# Patient Record
Sex: Male | Born: 1970
Health system: Western US, Academic
[De-identification: ages and names within clinical notes are randomized; demographics above are authoritative.]

---

## 2017-07-28 ENCOUNTER — Ambulatory Visit

## 2017-07-28 DIAGNOSIS — G8929 Other chronic pain: Secondary | ICD-10-CM

## 2017-07-28 DIAGNOSIS — M545 Low back pain: Secondary | ICD-10-CM

## 2017-07-28 NOTE — Progress Notes
S - Patient is seen for back pain. he is also seen by doctor Zachery Conch for his psychiatric problem. 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 the pain kept him from sleeping all night. he did not have Tylenol or Ibuprofen to relieve his pain, and he is hoping to get some today. Apparently he's new in [place]. when he was in [place] he was told that he had herniated disc, and he was prescribed Soma to help easing his back pain. he also has bad knees.    PMH - psychiatric problem with depression, currently on trazodone, bupropion & aripiprazole  DM on metformin & inj insulin (got it from hospital ER)  Seizure, on levetiracetam    Review of Systems   Constitutional: Negative for fever and chills.   HENT: Negative for sore throat and tinnitus.    Eyes: Negative for pain, discharge and redness.   Respiratory: Negative for cough, hemoptysis and wheezing.    Cardiovascular: Negative for chest pain, palpitations, orthopnea and PND.   Gastrointestinal: Negative for heartburn, nausea, vomiting and diarrhea.   Genitourinary: Negative for dysuria and flank pain.   Skin: Negative for itching and rash.     O - no distress, shaking right lower leg repetitively  Able to stand up from chair easily, gait normal  Tenderness over back from t1 level down to iliac crest, midline and paravetebral muscles  Straight leg raising test - no difficulty, no radiating pain  Knees full ROM    Last Recorded Vital Signs:    07/28/17 0921   BP: 96/59   Pulse: 71   Resp: 16   Temp: 36.9 ???C (98.4 ???F)   SpO2: 98%     A - back pain from back muscle strain. He needs to strengthen his back muscles. There is no evidence of disc disease with radiculopathy at this exam    Psychiatric problem, seen by Dr Zachery Conch today. Trazodone 100 mg HS recommended. He is short of this med, so limited amount will be offered to him today    P - ibuprofen 400 mg 40# dispensed, to be taken 1 q6h as needed  Trazodone 50 mg 20#, take 2 HS Note given to avoid straining his back for 2 weeks  See PT to strengthen his back  FU 2 wks

## 2017-08-11 ENCOUNTER — Ambulatory Visit

## 2017-08-11 ENCOUNTER — Ambulatory Visit: Attending: Family

## 2017-08-11 DIAGNOSIS — B353 Tinea pedis: Secondary | ICD-10-CM

## 2017-08-11 DIAGNOSIS — F259 Schizoaffective disorder, unspecified: Secondary | ICD-10-CM

## 2017-08-11 DIAGNOSIS — E1165 Type 2 diabetes mellitus with hyperglycemia: Secondary | ICD-10-CM

## 2017-08-11 DIAGNOSIS — K219 Gastro-esophageal reflux disease without esophagitis: Secondary | ICD-10-CM

## 2017-08-11 DIAGNOSIS — E1142 Type 2 diabetes mellitus with diabetic polyneuropathy: Secondary | ICD-10-CM

## 2017-08-11 MED ORDER — LURASIDONE HCL 20 MG PO TABS
20 mg | ORAL_TABLET | Freq: Every day | ORAL | 2 refills | Status: AC
Start: 2017-08-11 — End: ?

## 2017-08-11 MED ORDER — OMEPRAZOLE 20 MG PO CPDR
20 mg | ORAL_CAPSULE | Freq: Every morning | ORAL | 1 refills | Status: AC
Start: 2017-08-11 — End: ?

## 2017-08-11 MED ORDER — HYDROXYZINE HCL 50 MG PO TABS
50 mg | ORAL_TABLET | Freq: Every evening | ORAL | 2 refills | Status: AC | PRN
Start: 2017-08-11 — End: ?

## 2017-08-11 MED ORDER — INSULIN GLARGINE 100 UNIT/ML SC SOPN
25 [IU] | PEN_INJECTOR | Freq: Two times a day (BID) | SUBCUTANEOUS | 1 refills | Status: AC
Start: 2017-08-11 — End: 2017-08-17

## 2017-08-11 MED ORDER — TERBINAFINE HCL 1 % EX CREA
Freq: Two times a day (BID) | TOPICAL | 2 refills | Status: AC
Start: 2017-08-11 — End: ?

## 2017-08-11 NOTE — Progress Notes
[facility]  Brief SOAP Note    SUBJECTIVE:  Chief Complaint   Patient presents with   ??? Diabetes     Pt here for psych visit with Dr. Zachery Conch and I was pulled into case to consult for DM management. Pt reports being in [facility] for four days and was just released in last day or so. Pt given refill RX for metformin 1000mg  BID and glipizide 5mg  daily. No refill RX for lantus insulin, but reports he is taking lantus via pen - 20 units BID. Pt has an empty pen with him in his bag. Pt states this morning he took the metformin, glipizide and the last of the lantus insulin, and his BS in the clinic now is 242. [place] [facility] [place] [facility]. Pt reports having seizures and taking Keppra. Also with mental health conditions and was taking trazodone, Abilify and bupropion. After visit with Dr. Zachery Conch today he is taking latuda and bupropion. Pt states he feels some numbness and tingling in his feet at night. Otherwise not feeling effects of high blood sugar.    Along with the GI upset reported with metformin doses he also c/o acid reflux. Doesn't report taking any acid reflux medication.    Past Medical History:   Diagnosis Date   ??? Arthritis    ??? Bipolar 2 disorder (HCC/RAF)    ??? Chronic back pain    ??? Diabetes (HCC/RAF)    ??? Hypertension    ??? Insomnia    ??? Seizures (HCC/RAF)      Past Surgical History:   Procedure Laterality Date   ??? APPENDECTOMY     ??? KNEE SURGERY  2006/2009    right knee 2 surgeries     No Known Allergies       Outpatient Prescriptions Marked as Taking for the 08/11/17 encounter (Office Visit) with Cliffton Asters., NP:   ???  buPROPion, SR, 150 mg 12 hr tablet, 150 mg, Oral, QAM,   ???  glipiZIDE 5 mg tablet, 5 mg, Oral, Daily with breakfast,   ???  levETIRAcetam 100 mg/mL solution, 10 mg/kg, Oral, Daily,   ???  metFORMIN 1000 mg tablet, 1,000 mg, Oral, BID w/meals,      Family History   Problem Relation Age of Onset   ??? Diabetes Mother    ??? Breast cancer Mother ??? Diabetes Father    ??? Hypertension Father    ??? Mental illness Father    ??? Mental illness Sister    ??? Mental retardation Maternal Aunt    ??? Mental retardation Maternal Uncle      Social History     Social History   ??? Marital status: Single     Spouse name: N/A   ??? Number of children: N/A   ??? Years of education: N/A     Occupational History   ??? Not on file.     Social History Main Topics   ??? Smoking status: Former Smoker     Types: Cigarettes   ??? Smokeless tobacco: Never Used   ??? Alcohol use No      Comment: Quit 04/2017   ??? Drug use: Yes     Types: Marijuana      Comment: Quit  4 years ago   ??? Sexual activity: Not Currently     Partners: Female     Other Topics Concern   ??? Do You Exercise At Least A Day, 3 Or More Days  A Week? No   ??? Types Of Exercise? (List In Comments) No   ??? Do You Follow A Special Diet? No     smaller meals    ??? Vegan? No   ??? Vegetarian? No   ??? Pescatarian? No   ??? Lactose Free? No   ??? Gluten Free? No   ??? Omnivore? No     Social History Narrative   ??? No narrative on file     Review of Systems   Constitutional: Negative for chills, fever and malaise/fatigue.   Eyes: Negative for blurred vision.   Gastrointestinal: Positive for heartburn.        (+) bloating, cramping associated with taking metformin.   Neurological: Negative for dizziness and headaches.   Psychiatric/Behavioral: Positive for depression. Negative for suicidal ideas.     OBJECTIVE:  None taken today since it was a primarily psych encounter    Physical Exam   Constitutional: He is well-developed, well-nourished, and in no distress. No distress.   Neurological: He is alert. Gait normal.   Skin: Skin is warm.   Pedal pulses are strong and intact 2+. Bilateral feet with dry flaking & peeling skin around toes and on sides and plantar surfaces. With very thickened dead skin on bilateral heels. Has moist white skin between 4th and 5th toes on right foot and a fissure in the skin due to the moisture and fungal infection. Monofilament exam shows several areas of lack of sensation on balls of feet and middle toes on bilateral feet. Pt also not able to sense the filament on the heels. No ulcers, erythema, edema, calluses or corns noted. No toenail fungus noted.   Psychiatric:   Affect flat; memory is suspicious as he tells different things to different providers.     ASSESSMENT/PLAN:  1. 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 Continue taking glipizide and metformin as ordered by previous doctors. Return next week for f/u and BS check.  - insulin glargine (LANTUS SOLOSTAR) 100 units/mL injection pen; Inject 0.25 mLs (25 Units total) under the skin two (2) times daily.  Dispense: 5 pen; Refill: 1  - Diabetes Foot Exam Performed by Provider Today (Not A Referral)    2. Gastroesophageal reflux disease, esophagitis presence not specified  Acid reflux vs metformin SE. Will f/u next week. For now, take omeprazole daily 30 min before other meds and food.  - omeprazole 20 mg DR capsule; Take 1 capsule (20 mg total) by mouth every morning before breakfast.  Dispense: 30 capsule; Refill: 1    3. Tinea pedis of both feet  The pathology of fungus on feet were discussed. Keep feet clean and dry to avoid fungal growth. Use socks with shoes, and allow feet to be exposed to air at least for 30 min twice a day. After cleaning feet and allowing feet to dry completely, apply prescribed fungal cream twice a day, for as much as 3-4 weeks. If not seeing improvement within 1 week, return for evaluation.  - terbinafine 1% cream; Apply topically two (2) times daily To both feet.  Dispense: 42 g; Refill: 2    FOLLOW UP:  Return in about 1 week (around 08/18/2017) for follow up DMT2.    Cliffton Asters, NP 08/11/2017 5:38 PM

## 2017-08-12 NOTE — Progress Notes
PATIENT: Angel Bishop   MRN: [id number]   DOB: 1970/10/28      DATE OF SERVICE: 08/11/2017   TIME OF SERVICE: 5:24 PM      Interval history: The patient was seen and examined by me today.     Subjective:       ***     Objective:      Vitals: See rooming notes    Sleep: ***    Appetite: {PSY77:22919}  Concentration: {NFA21:30865}   Energy: {HQI69:62952}    Medications:  Current Outpatient Prescriptions   Medication Sig   ??? buPROPion, SR, 150 mg 12 hr tablet Take 150 mg by mouth every morning.   ??? glipiZIDE 5 mg tablet Take 5 mg by mouth daily with breakfast.   ??? hydrOXYzine 50 mg tablet Take 1 tablet (50 mg total) by mouth at bedtime as needed.   ??? insulin glargine (LANTUS SOLOSTAR) 100 units/mL injection pen Inject 0.25 mLs (25 Units total) under the skin two (2) times daily.   ??? levETIRAcetam 100 mg/mL solution Take 10 mg/kg by mouth daily.   ??? lurasidone 20 mg tablet Take 1 tablet (20 mg total) by mouth daily with breakfast.   ??? metFORMIN 1000 mg tablet Take 1,000 mg by mouth two (2) times daily with meals.   ??? omeprazole 20 mg DR capsule Take 1 capsule (20 mg total) by mouth every morning before breakfast.   ??? terbinafine 1% cream Apply topically two (2) times daily To both feet.     No current facility-administered medications for this visit.           Adherence to medication regimen:  { :25048}  Side Effects to Medications: EPS noted:  {yes/no:311178}, Chest pain:   {yes/no:311178}, Palpitations: {yes/no:311178}, Headache:  {yes/no:311178}, N/V/D:  {yes/no:311178}, Tremors:    {yes/no:311178}         Mental Status Examination:   Appearance: {PSY 43:22746}   Appears: {PSY 44:22745}  Eye Contact: { :20673}   Behavior and Motor Activity: {PSY 46:22744::''No psychomotor agitation, retardation, tremor or tremulousness.''}  Attitude: {PSY 47:22743}   Speech: {PSY 48:22742::''No abnormalities noted (spontaneous, regular in rate, rhythm, volume and quantity).''}  Affect: {PSY 49:22741}   Mood: {PSY 50:22739} Thought Process: {PSY 51:22738}  Thought Content: {PSY 52:22737}   Perception: {PSY 57:22733}  Cognition: {PSY 60:22711}  Judgment: {PSY 65:22707}  Insight: {PSY 65:22707}    Lab Data:   *** HELP TEXT ***    This SmartLink shows component results for current & previous visits.    It will accept a parameter to specify a lookback time duration.    For example, you may enter .RESULTRCNT[6W    The ''6W'' instructs the system to search 6 weeks back from now to find and display all component values in that time period.    Entry for this parameter may be in the form #H or #W. The ''#'' indicates a number and the H or W stand for hours or weeks respectively.[6W      Assessment:      Diagnoses and all orders for this visit:    Schizoaffective disorder, unspecified type (HCC/RAF)    Other orders  -     lurasidone 20 mg tablet; Take 1 tablet (20 mg total) by mouth daily with breakfast.  -     hydrOXYzine 50 mg tablet; Take 1 tablet (50 mg total) by mouth at bedtime as needed.              To Address at  Next Visit:   No Follow-up on file.        Author: Lazaro Arms. Zachery Conch, NP  08/11/2017 5:24 PM

## 2017-08-17 MED ORDER — INSULIN GLARGINE 100 UNIT/ML SC SOLN
50 [IU] | Freq: Every morning | SUBCUTANEOUS | 0 refills | Status: AC
Start: 2017-08-17 — End: ?

## 2017-08-17 MED ORDER — INSULIN SYRINGE-NEEDLE U-100 31G X 5/16'' 0.5 ML MISC
1 | Freq: Every day | SUBCUTANEOUS | 0 refills | Status: AC
Start: 2017-08-17 — End: ?

## 2017-08-18 ENCOUNTER — Ambulatory Visit

## 2017-08-18 ENCOUNTER — Ambulatory Visit: Attending: Family

## 2019-07-25 IMAGING — CR SACRUM-COCCYX 2 VWS MIN
1 series · 3 of 3 positions shown · non-contrast
Comparison: None

HISTORY/INDICATIONS:   Radiculopathy sacral and sacrococcygeal region
TECHNIQUE: Sacrum and coccyx  3 views

[Series 1: t sacrum ap · 0.15mm/px · 3 of 3 slices shown]
[im 1/3]
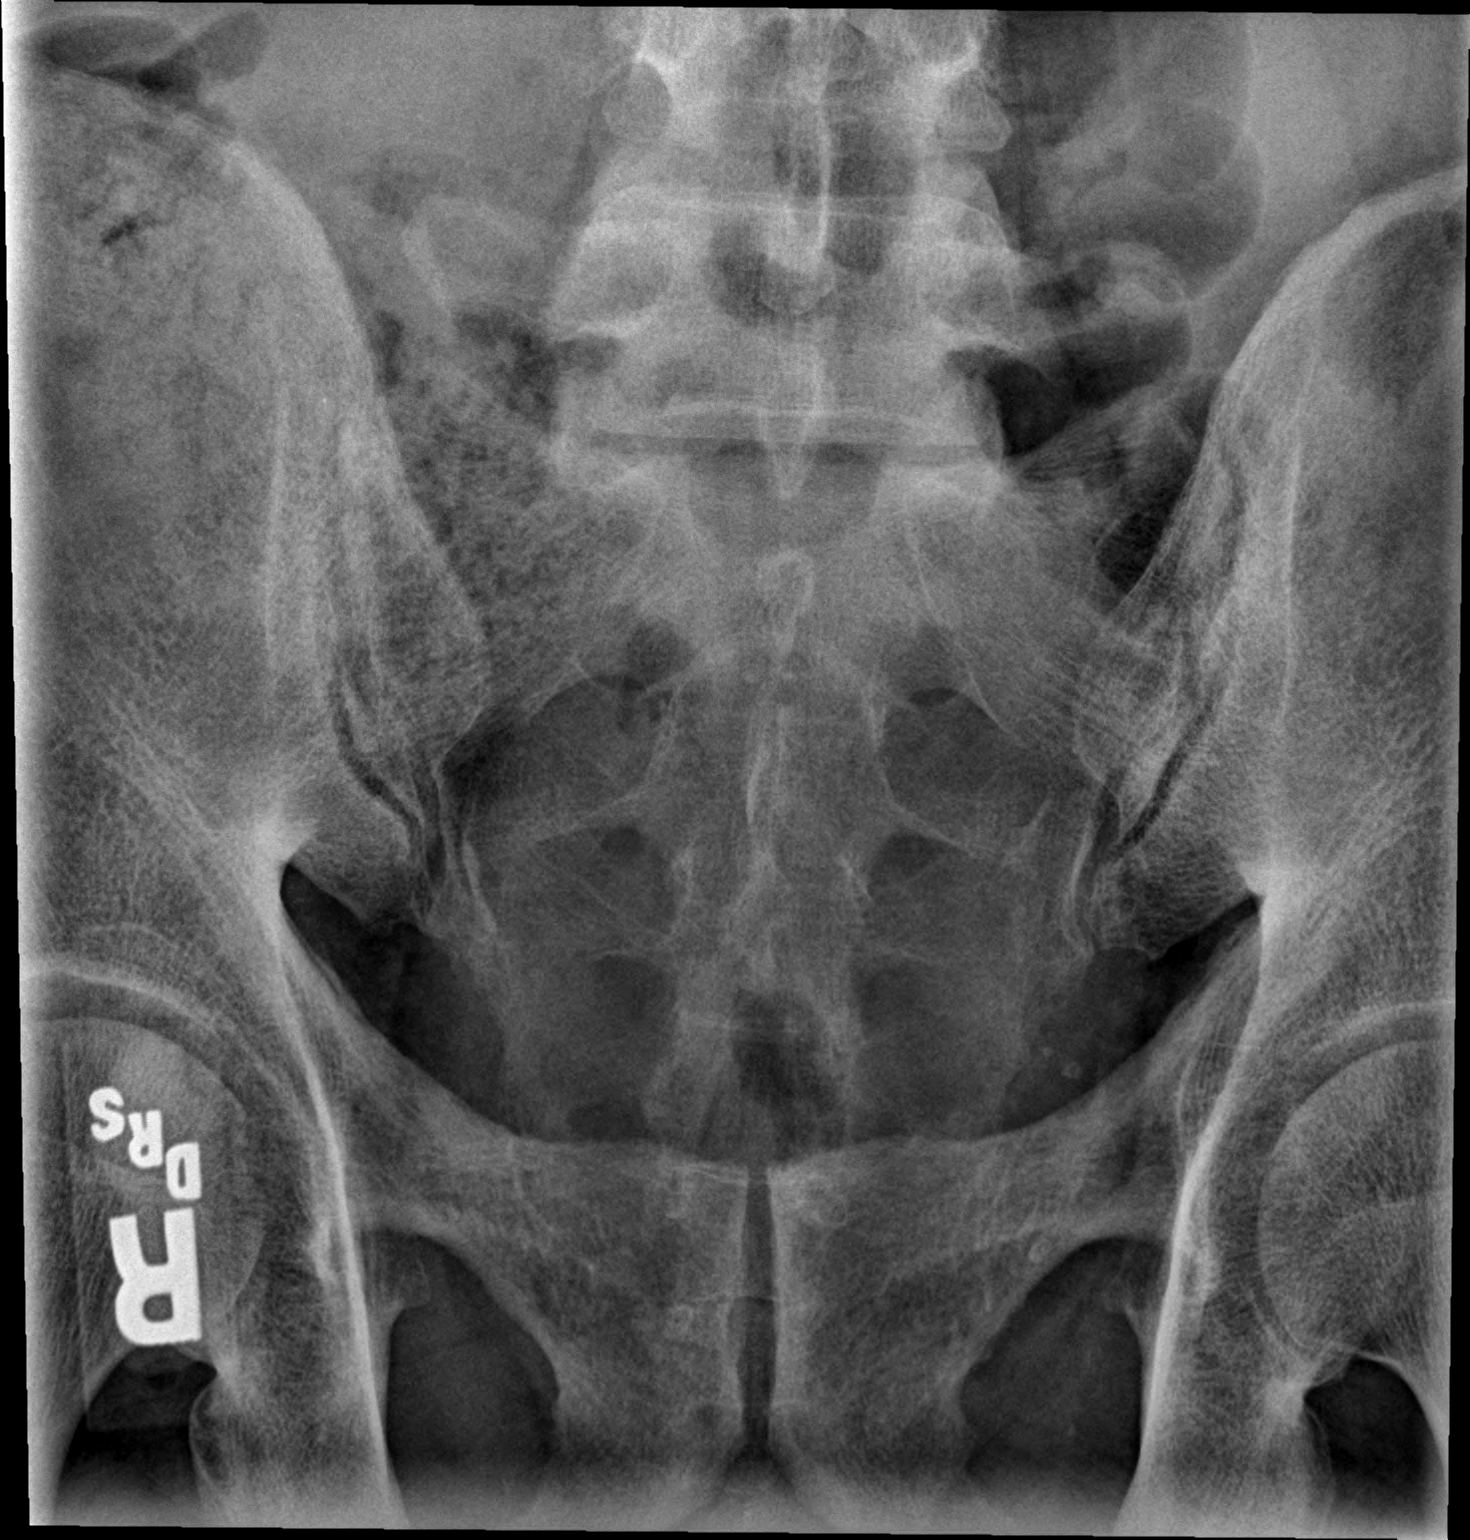
[im 2/3]
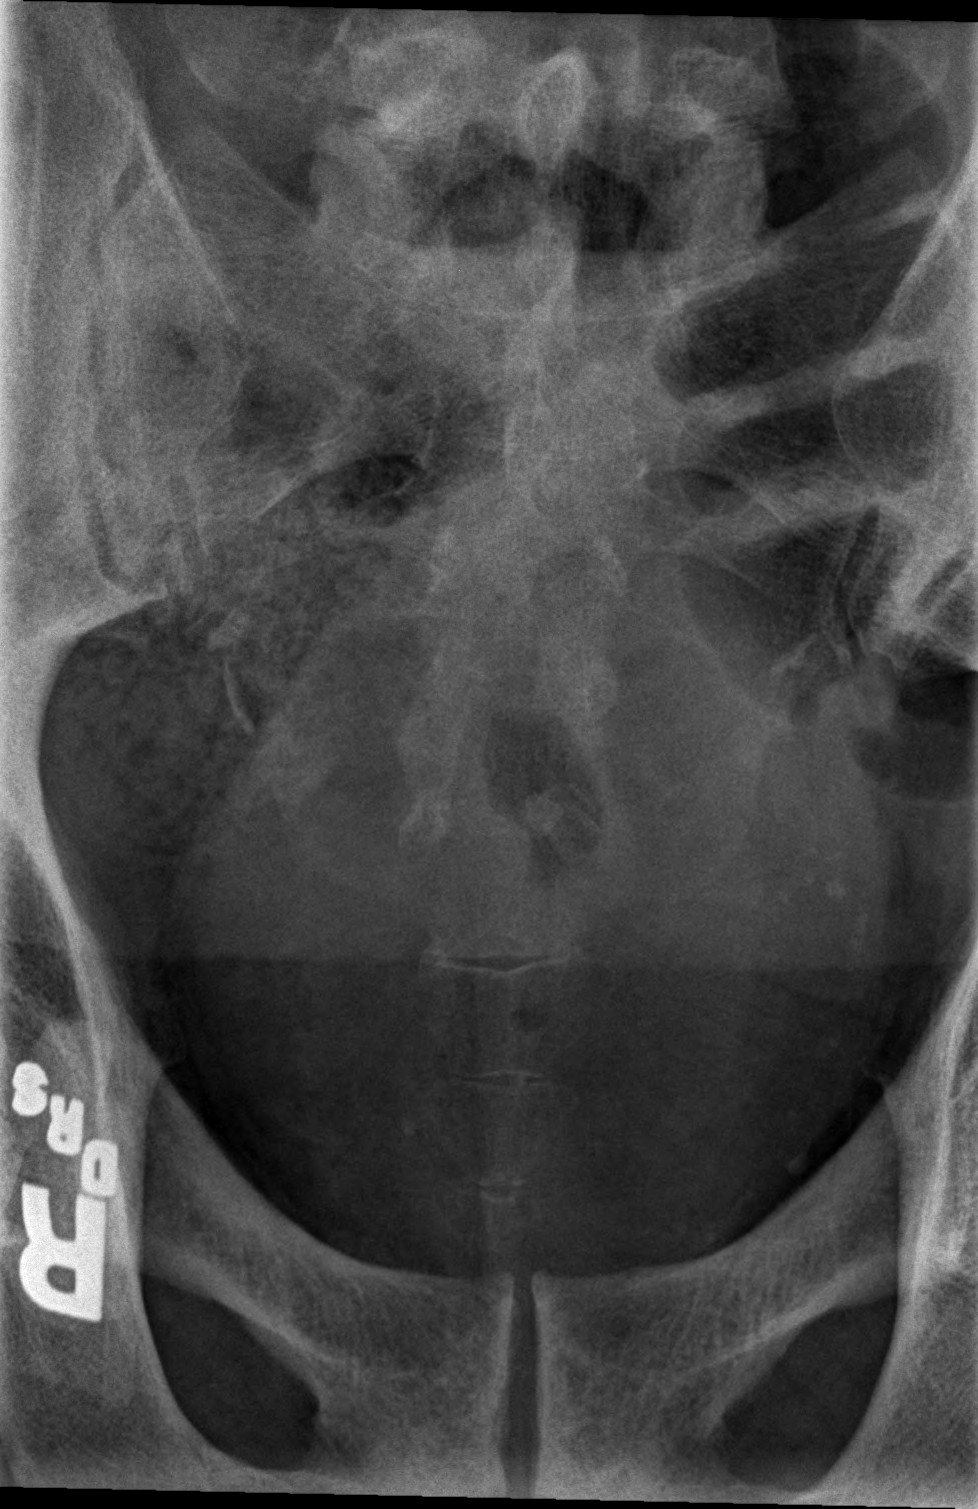
[im 3/3]
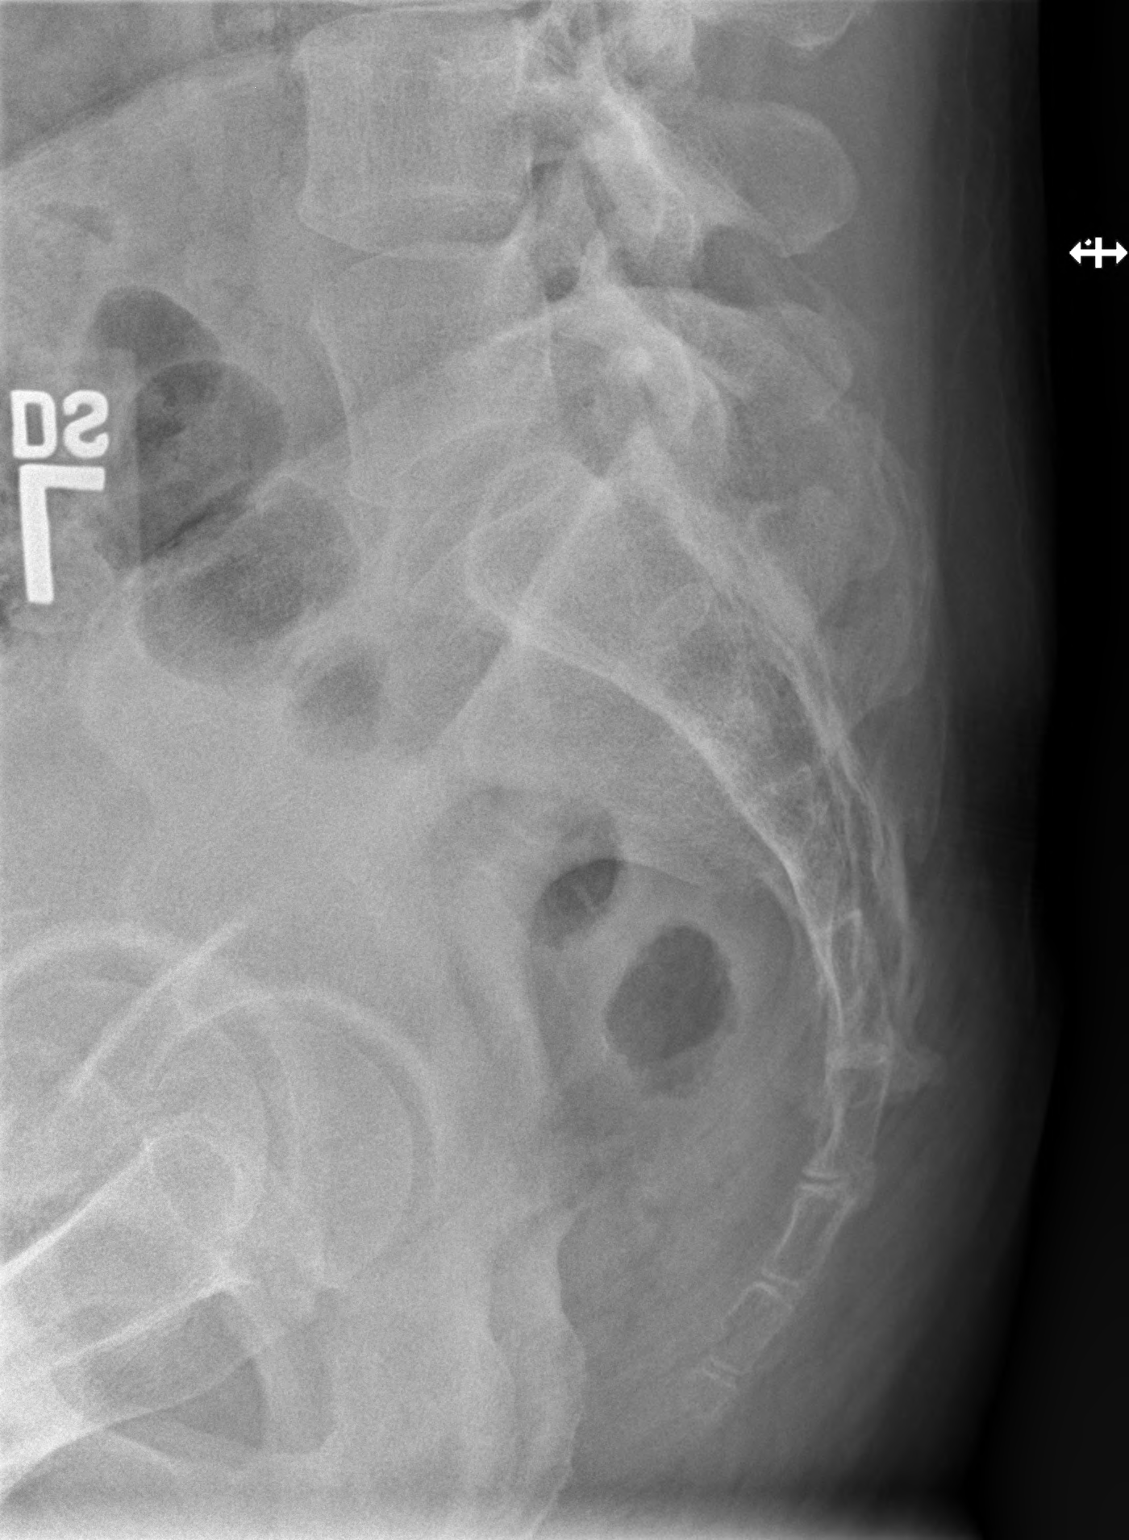

[3 of 3 positions shown; findings below may reference images not displayed]

FINDINGS: Views of the sacrum and coccyx reveal no evidence of acute fracture or other osseous abnormality.
IMPRESSION: Unremarkable sacrum and coccyx.

## 2019-07-25 IMAGING — CR L-SPINE 4 VWS MIN
1 series · 5 of 5 positions shown · non-contrast
Comparison: None

Lumbar spine 5 views
HISTORY: Radiculopathy, lumbosacral region

[Series 1: t lumbar spine ap · 0.15mm/px · 5 of 5 slices shown]
[im 1/5]
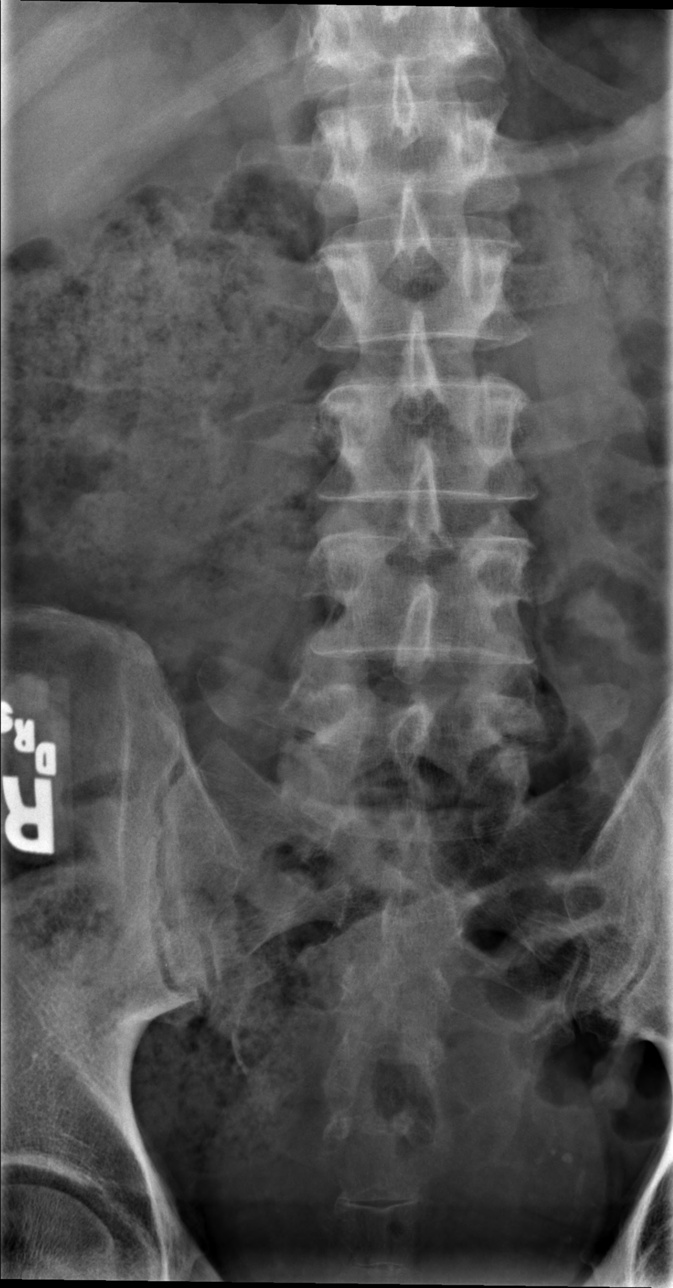
[im 2/5]
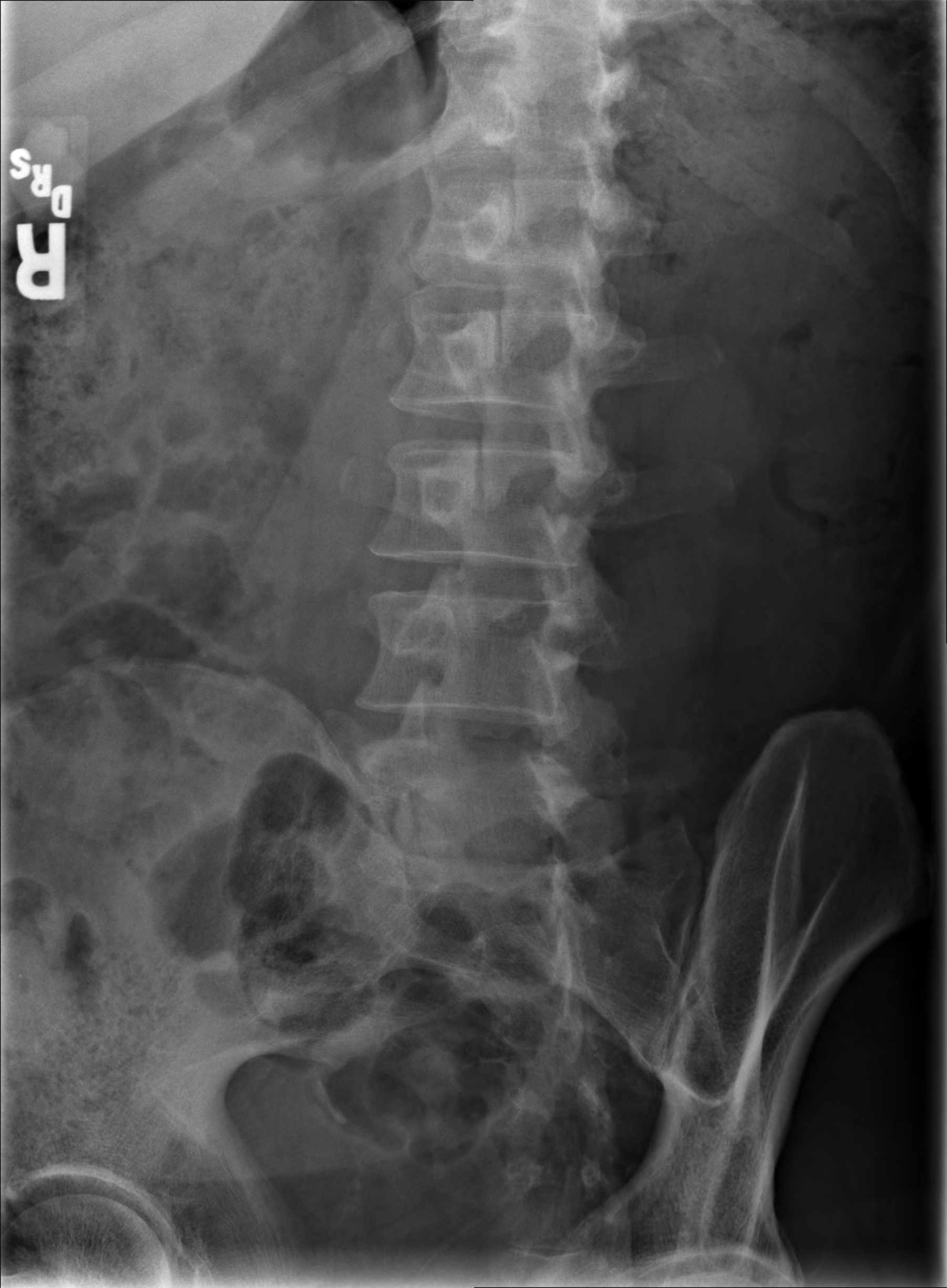
[im 3/5]
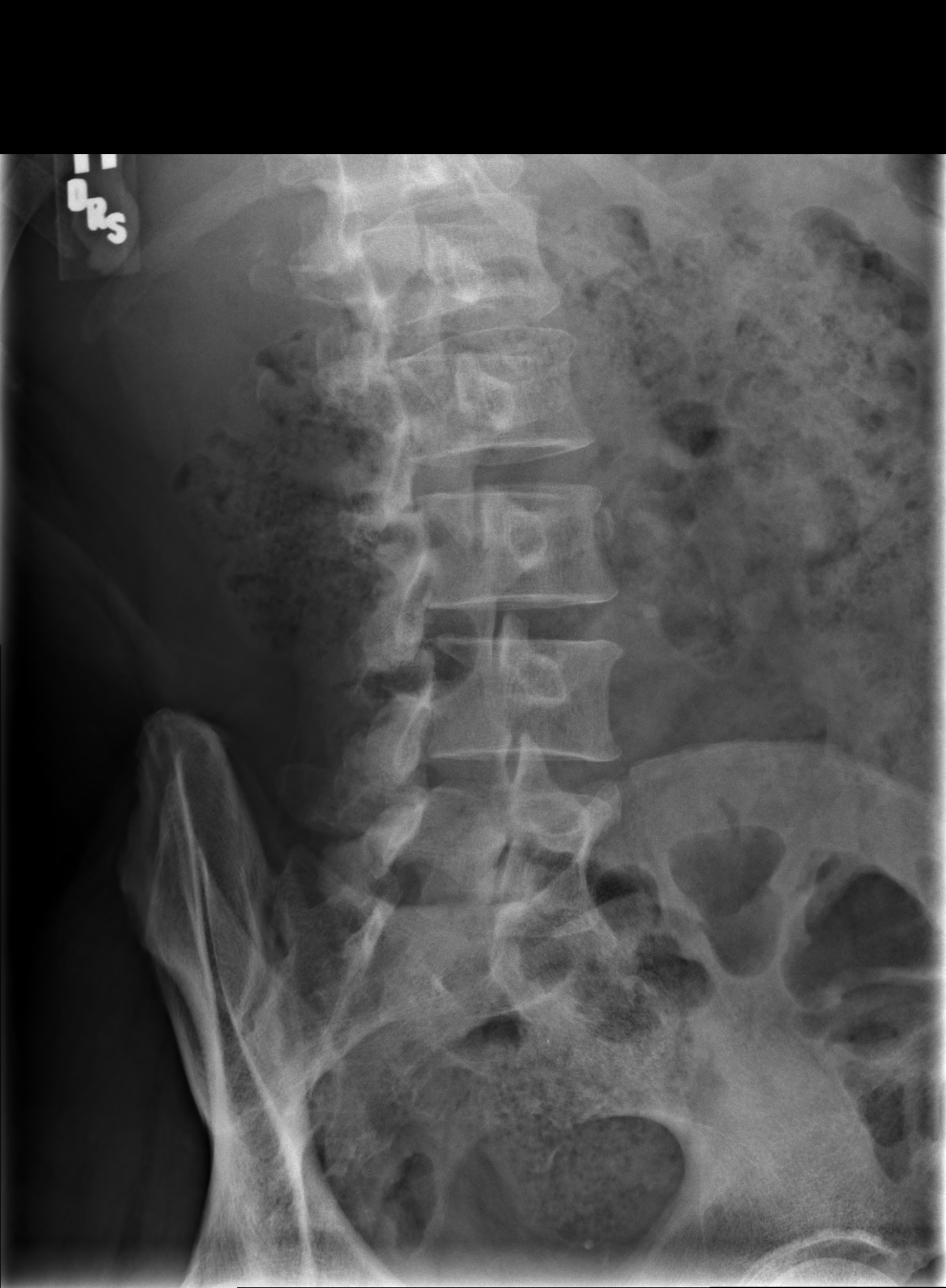
[im 4/5]
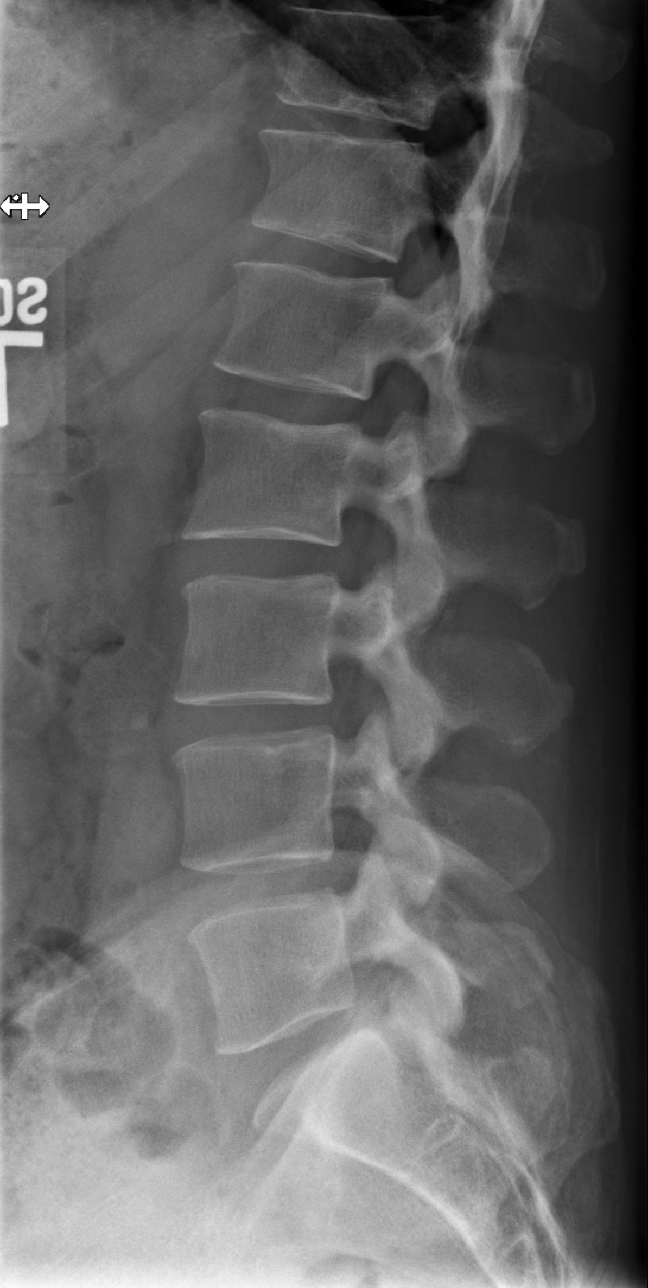
[im 5/5]
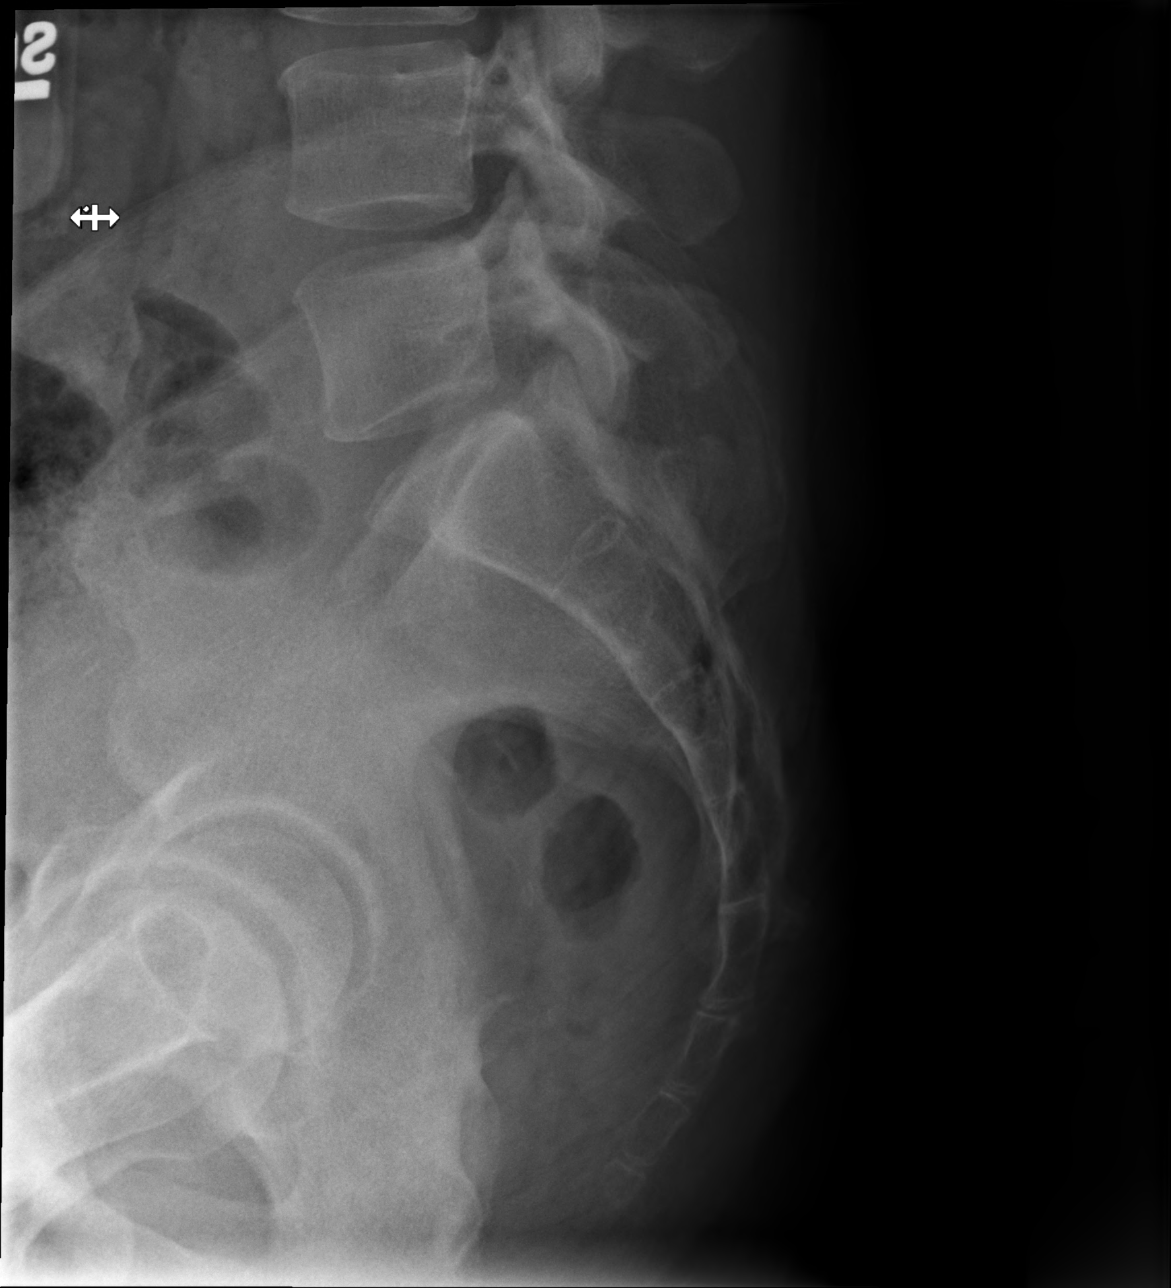

[5 of 5 positions shown; findings below may reference images not displayed]

FINDINGS: There are five lumbar segments.  There is no fracture, spondylosis or spondylolisthesis.  The disc spaces and sacroiliac joints are normal.
IMPRESSION: Normal lumbar spine.

## 2019-08-08 IMAGING — CR SHOULDER RT 2-3 VWS
1 series · 4 of 4 positions shown · non-contrast
Comparison: None.

Right shoulder 2 views 4 images

INDICATIONS:  Right shoulder pain

[Series 1: w shoulder internal right · 0.15mm/px · 4 of 4 slices shown]
[im 1/4]
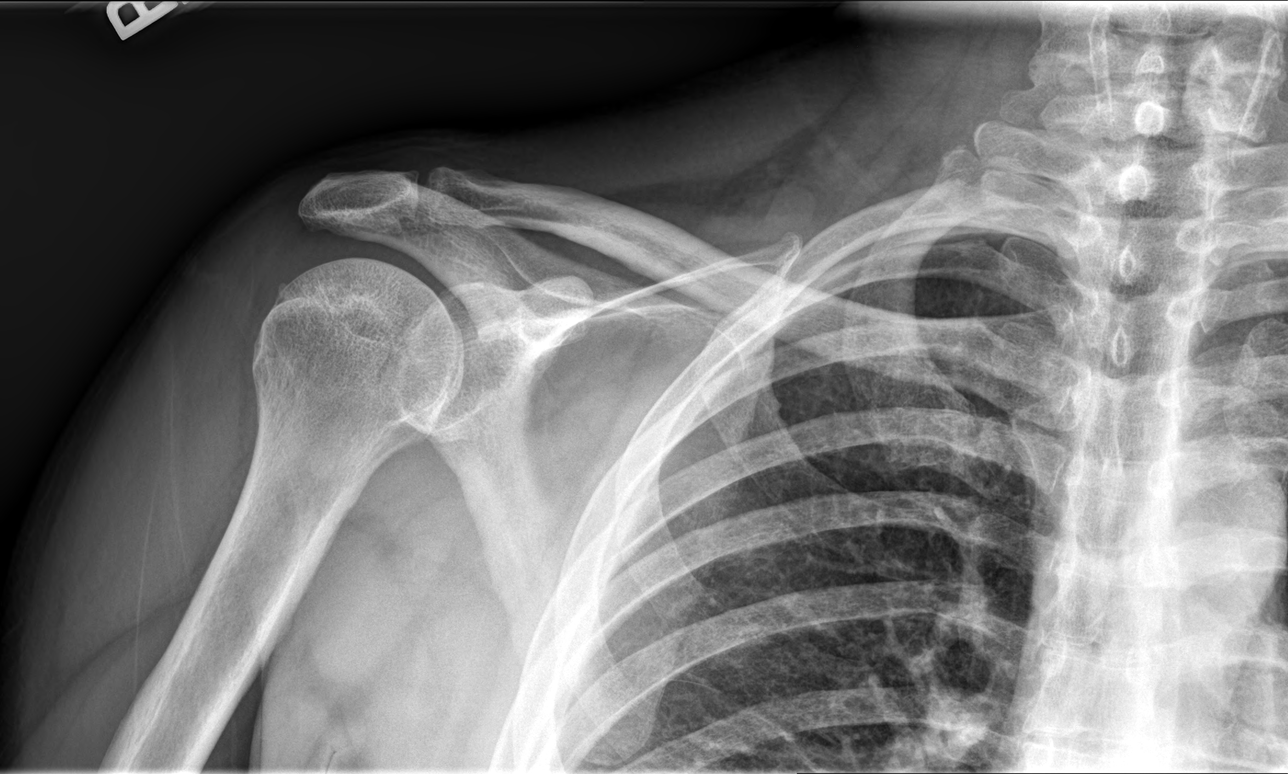
[im 2/4]
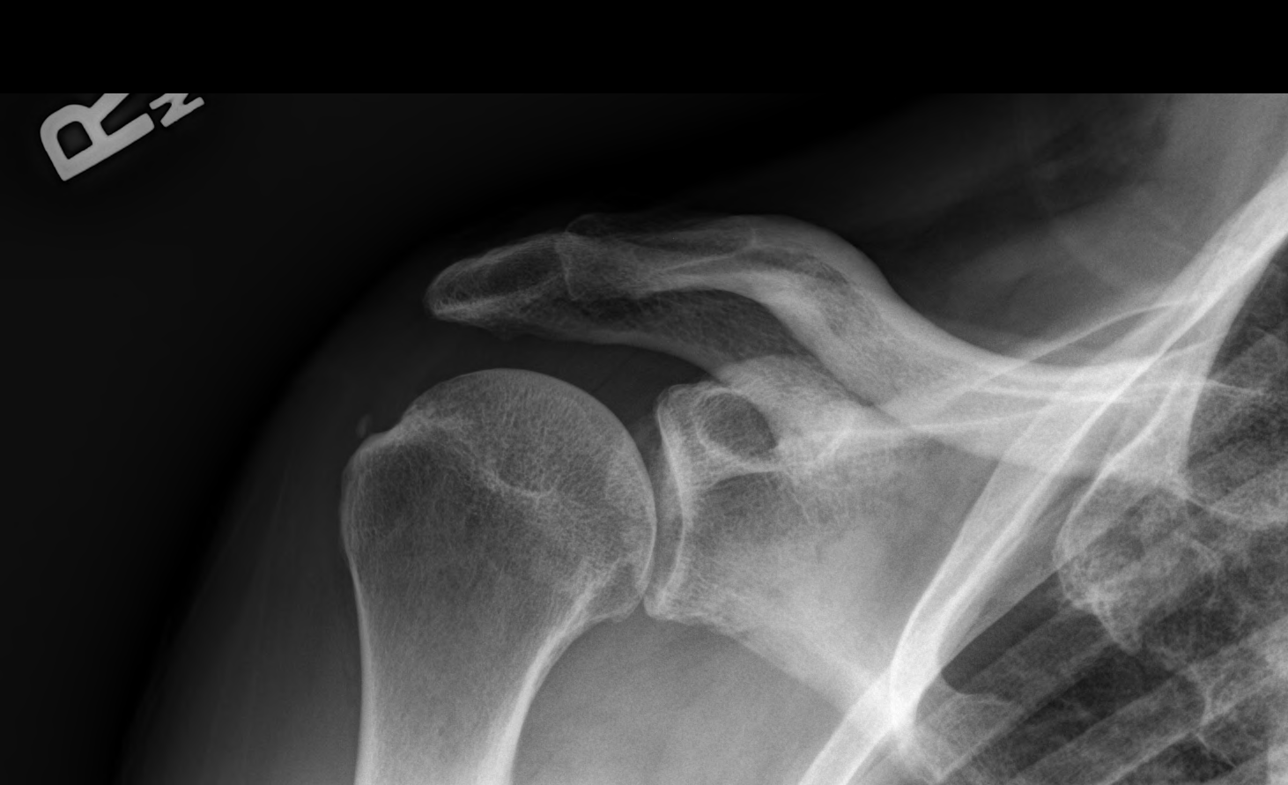
[im 3/4]
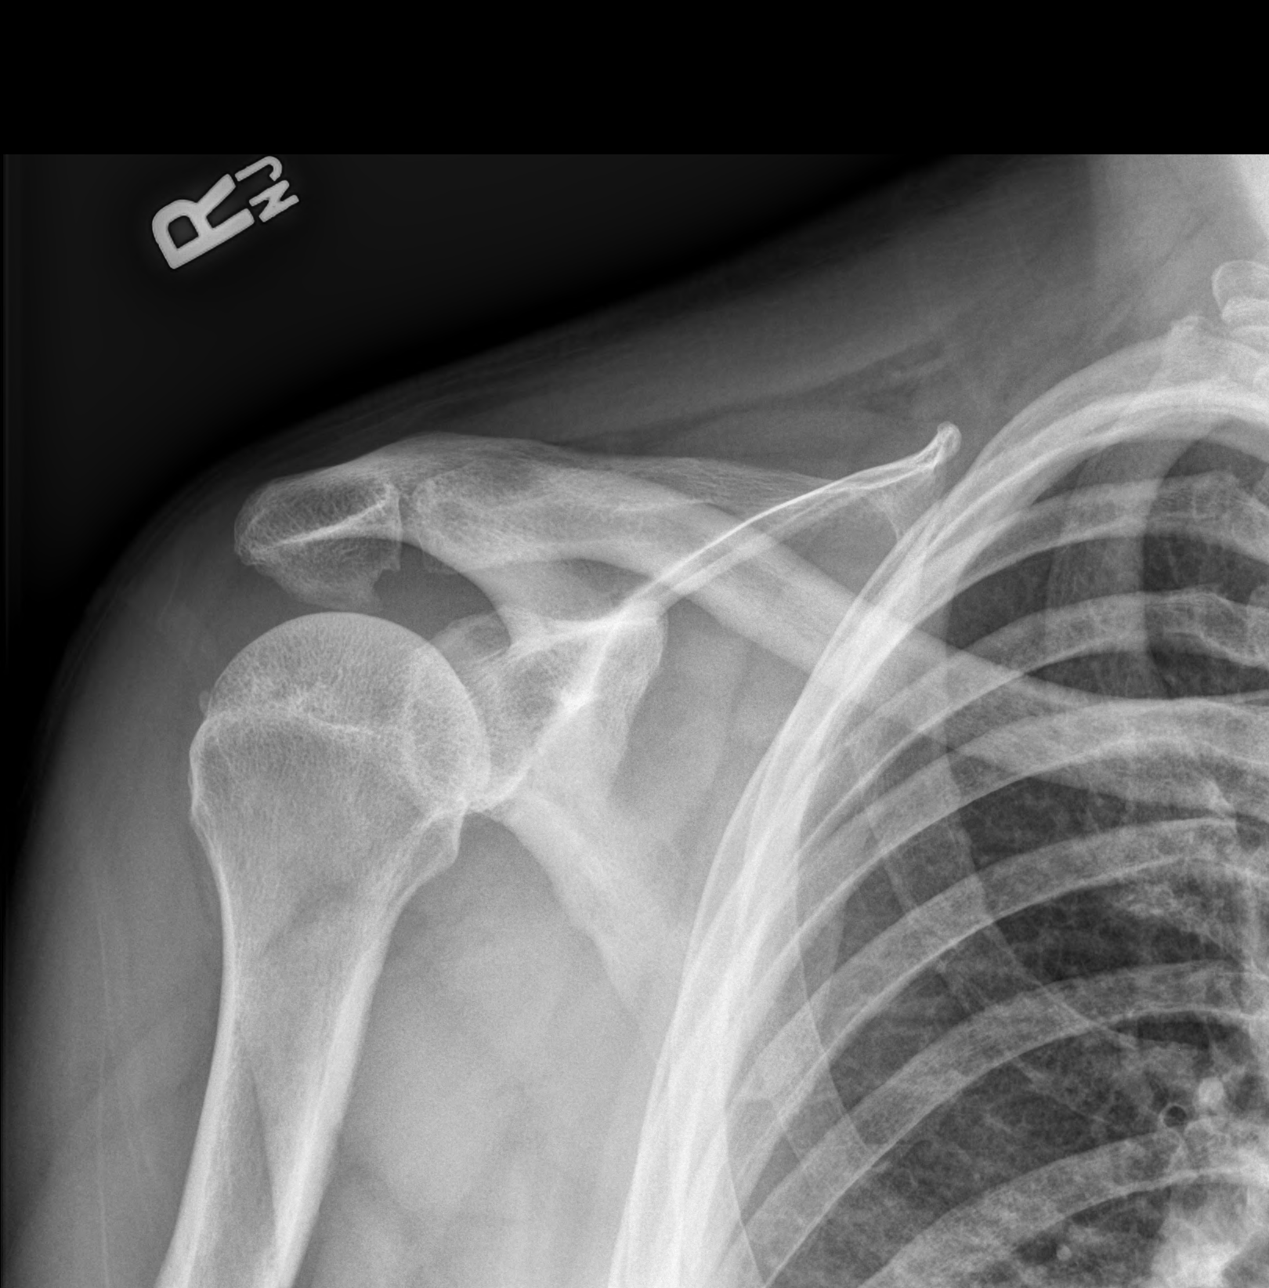
[im 4/4]
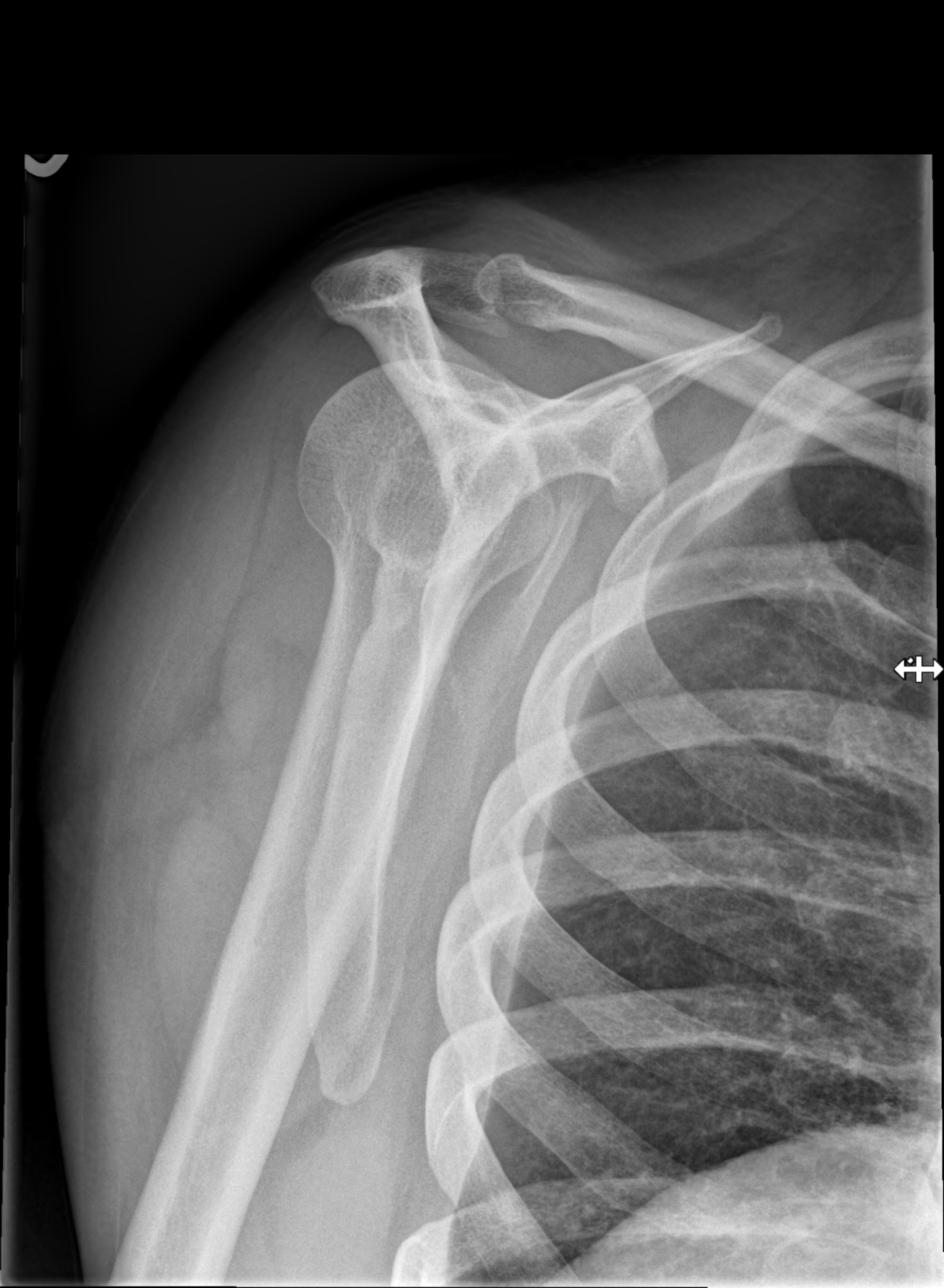

[4 of 4 positions shown; findings below may reference images not displayed]

FINDINGS: The glenohumeral and AC joints show extremely mild degenerative change. There is a small calcification near the rotator cuff insertion that could be calcific tendinitis.
IMPRESSION: Very mild degenerative change.

## 2019-09-13 IMAGING — MR MRI LSPINE WO CONTRAST
6 series · 47 of 48 positions shown · non-contrast
Comparison: Lumbar spine radiographs 07/25/2019.

INDICATION: Low back pain.
TECHNIQUE: Multiplanar, multiecho MR imaging of the lumbar spine was performed, including T1-weighted and fluid sensitive sequences without intravenous contrast.

[Series 11: iii_aaspine_lspine_mpr_cor · coronal · 1.7mm · 1.67mm/px · 19 of 80 slices shown]
[im 1/80]
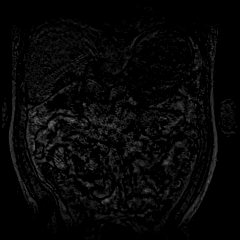
[im 5/80]
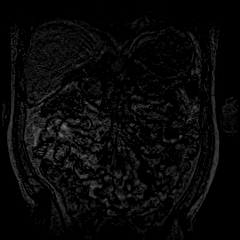
[im 9/80]
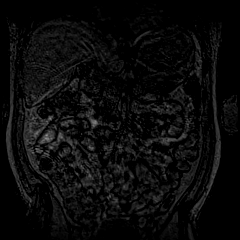
[im 14/80]
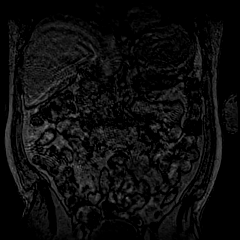
[im 18/80]
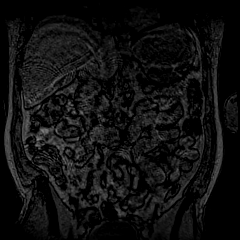
[im 22/80]
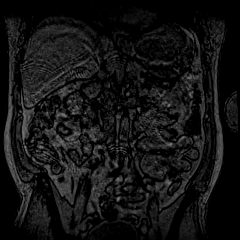
[im 27/80]
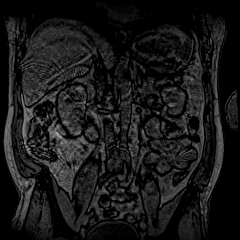
[im 31/80]
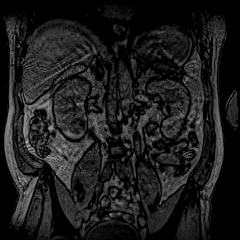
[im 36/80]
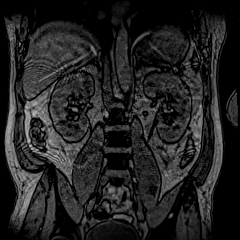
[im 40/80]
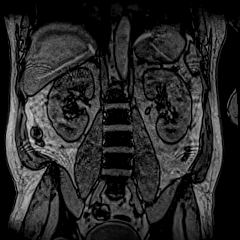
[im 44/80]
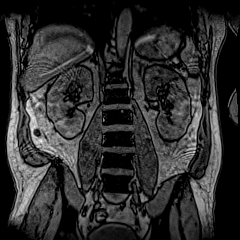
[im 49/80]
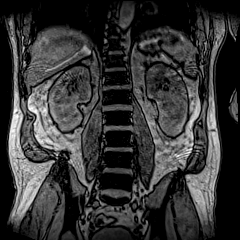
[im 53/80]
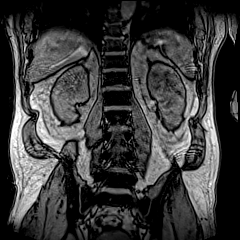
[im 58/80]
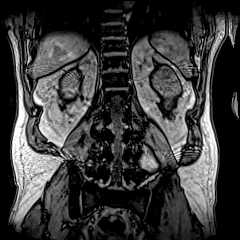
[im 62/80]
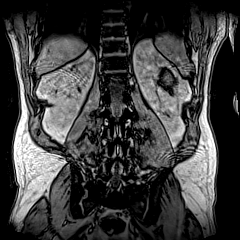
[im 66/80]
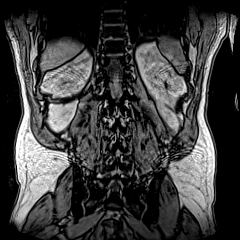
[im 71/80]
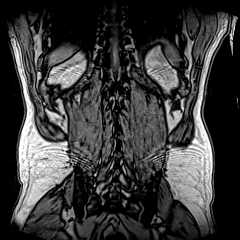
[im 75/80]
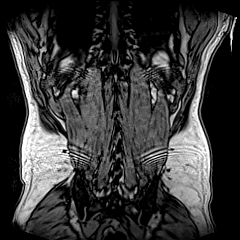
[im 80/80]
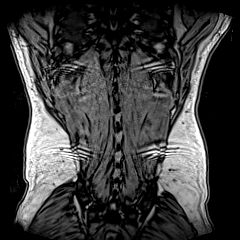

[Series 18: t2_sag · sagittal · 4.0mm · 0.68mm/px · 4 of 15 slices shown]
[im 1/15]
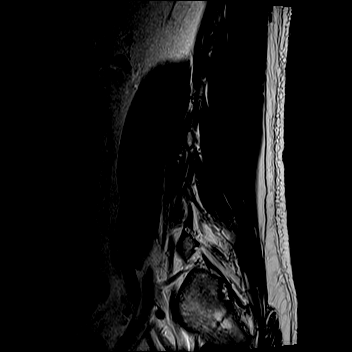
[im 5/15]
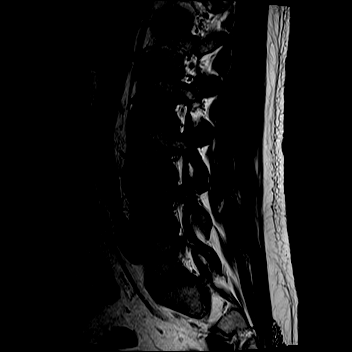
[im 10/15]
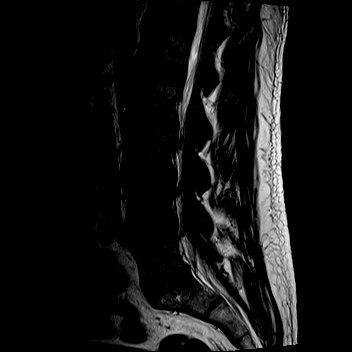
[im 15/15]
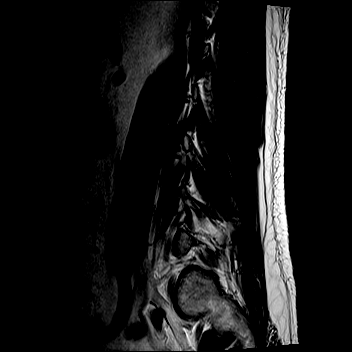

[Series 19: t1_sag · sagittal · 4.0mm · 0.75mm/px · 4 of 15 slices shown]
[im 1/15]
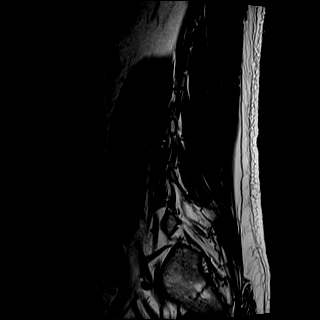
[im 5/15]
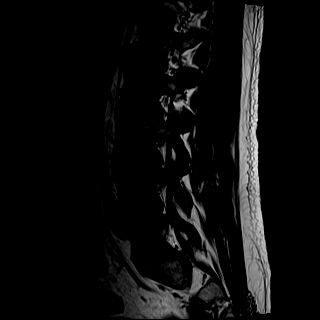
[im 10/15]
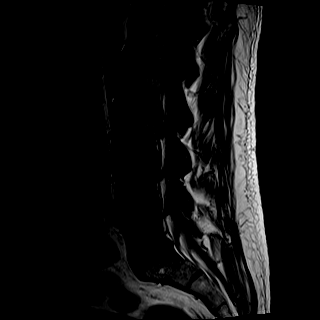
[im 15/15]
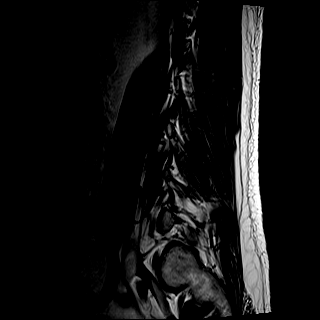

[Series 20: ir_sag · sagittal · 4.0mm · 0.94mm/px · 4 of 15 slices shown]
[im 1/15]
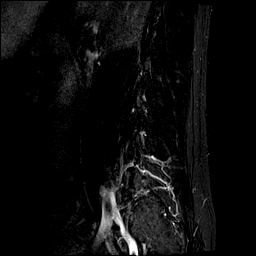
[im 5/15]
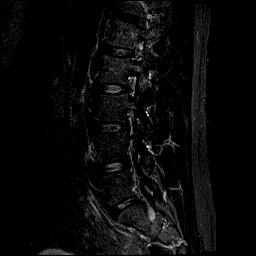
[im 10/15]
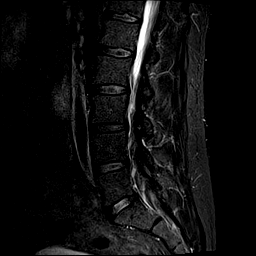
[im 15/15]
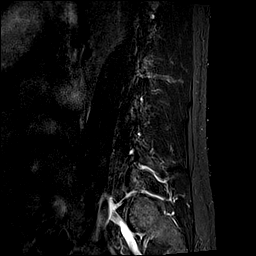

[Series 21: t2_axial · axial · 4.0mm · 0.62mm/px · z∈[-571,-366]mm · 10 of 44 slices shown]
[im 1/44]
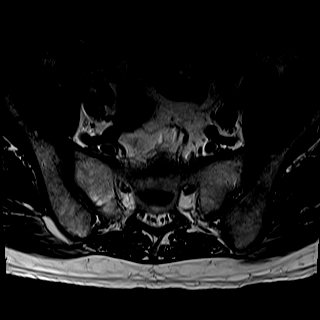
[im 5/44]
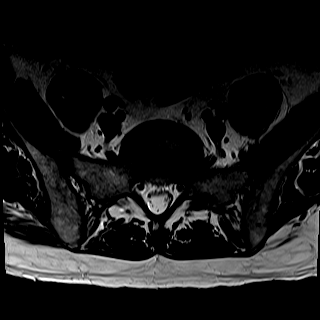
[im 9/44]
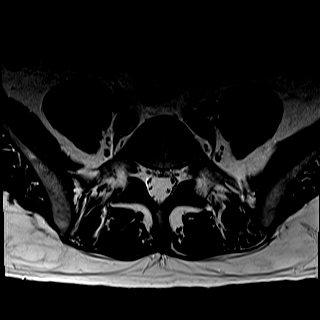
[im 13/44]
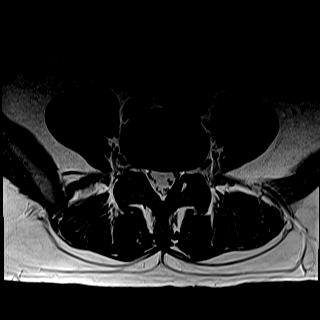
[im 18/44]
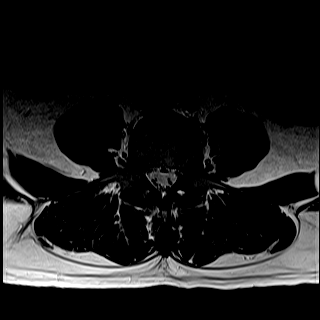
[im 22/44]
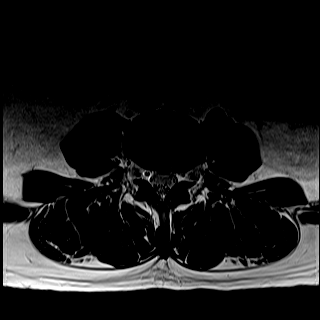
[im 26/44]
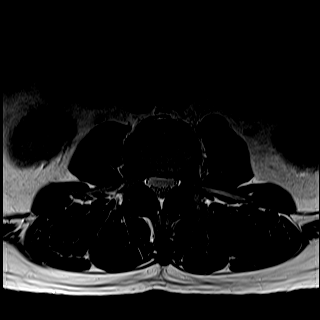
[im 31/44]
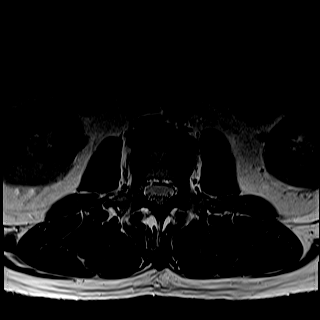
[im 35/44]
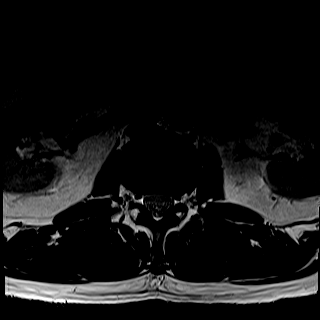
[im 44/44]
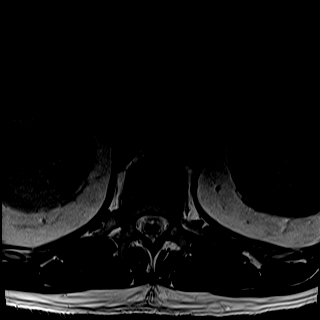

[Series 22: t1_axial_obl · axial · 3.0mm · 0.43mm/px · z∈[-588,-382]mm · 6 of 26 slices shown]
[im 1/26]
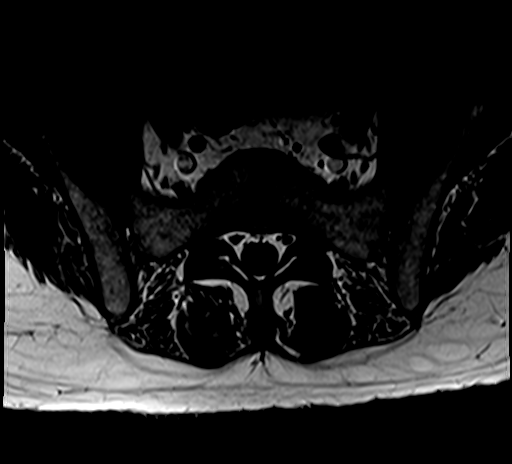
[im 6/26]
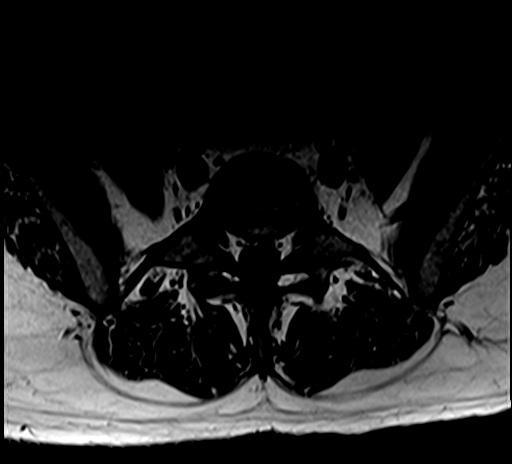
[im 11/26]
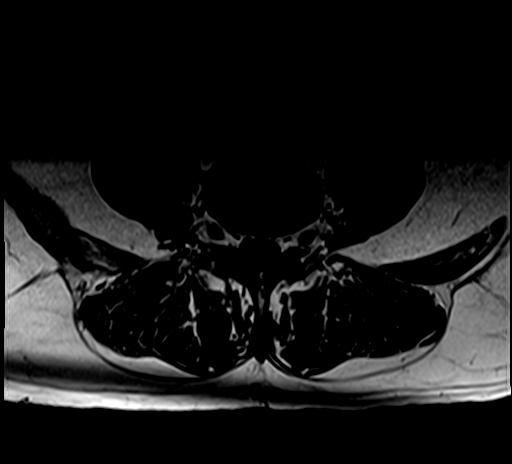
[im 16/26]
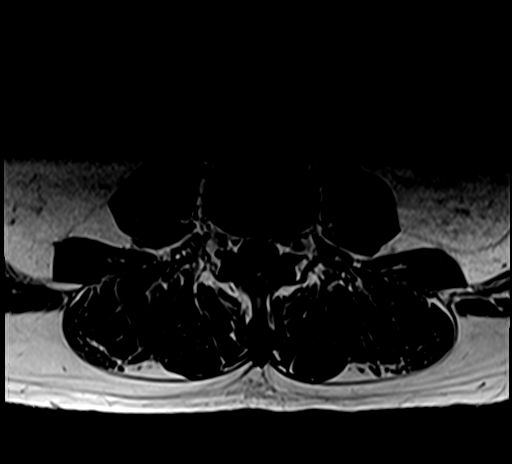
[im 21/26]
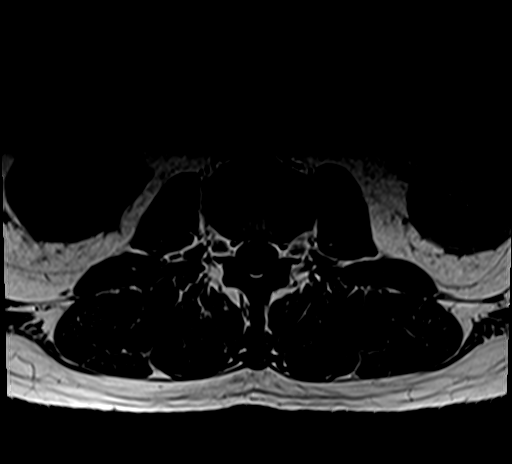
[im 26/26]
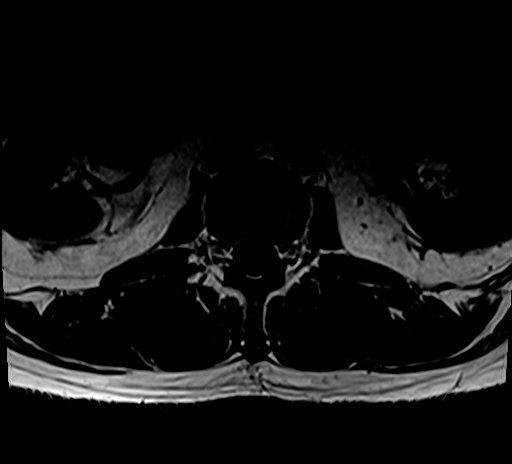

[47 of 48 positions shown; findings below may reference images not displayed]

FINDINGS: Conus: The conus is normal in appearance and position.  

Alignment: The alignment of the lumbar spine is normal on these supine, neutral images.   

Marrow: No acute fracture. No pars defect.  

T12-L1: No disc protrusion. No canal or foraminal stenosis.

L1-L2: No disc protrusion, canal stenosis, or foraminal stenosis.

L2-L3: No disc protrusion, canal stenosis, or foraminal stenosis.

L3-L4: Minimal disc bulge. No canal or foraminal stenosis. No facet arthrosis.

L4-L5: Minimal disc bulge. No facet arthrosis. No canal or foraminal stenosis.

L5-S1: No disc protrusion, canal stenosis, or foraminal stenosis.

Visualized SI joints: Intact

Visualized soft tissues: Unremarkable
IMPRESSION: Minimal degenerative disc disease at L3-L5 with no canal or foraminal stenosis.

## 2020-09-11 IMAGING — CR HAND LT 3 VWS MIN
1 series · 3 of 3 positions shown · non-contrast
Comparison: None

Left hand 3 views

INDICATIONS:  Left hand pain. Fall.

[Series 1: x hand pa left · 0.15mm/px · 3 of 3 slices shown]
[im 1/3]
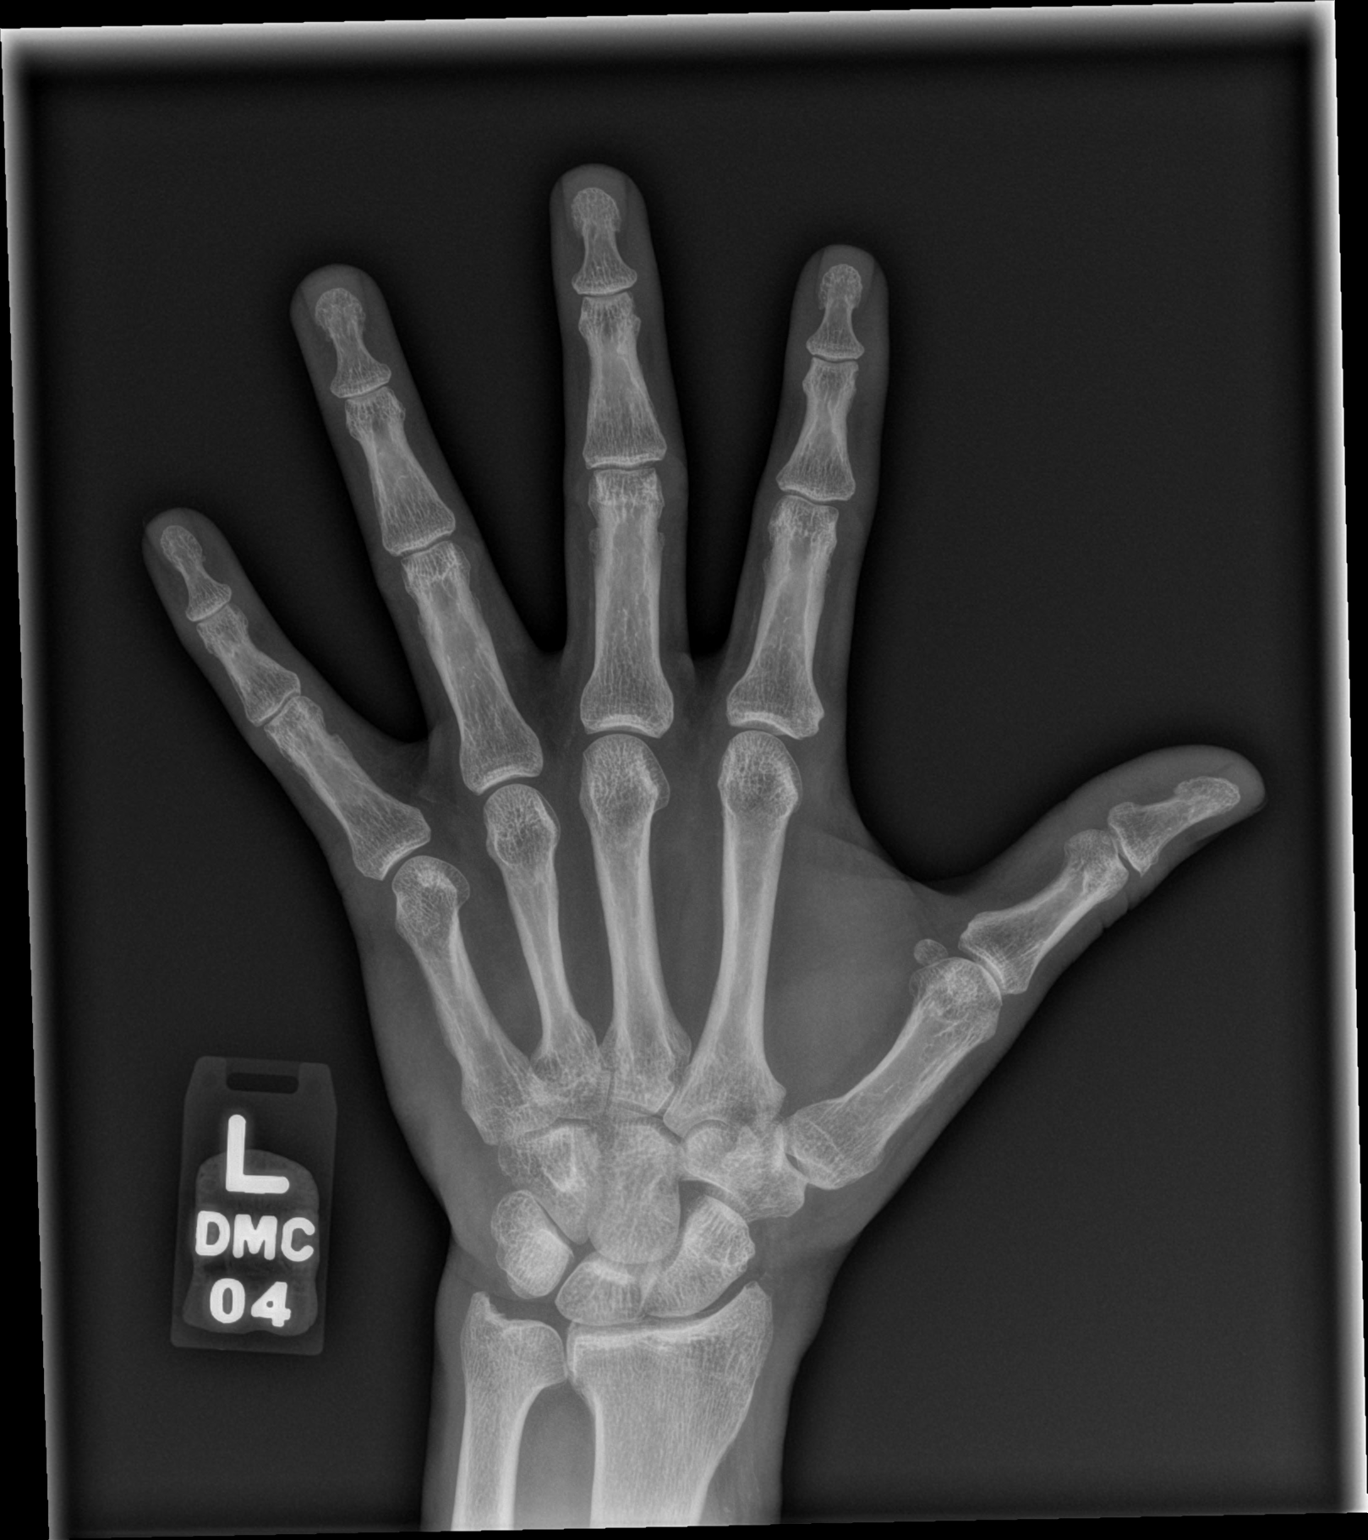
[im 2/3]
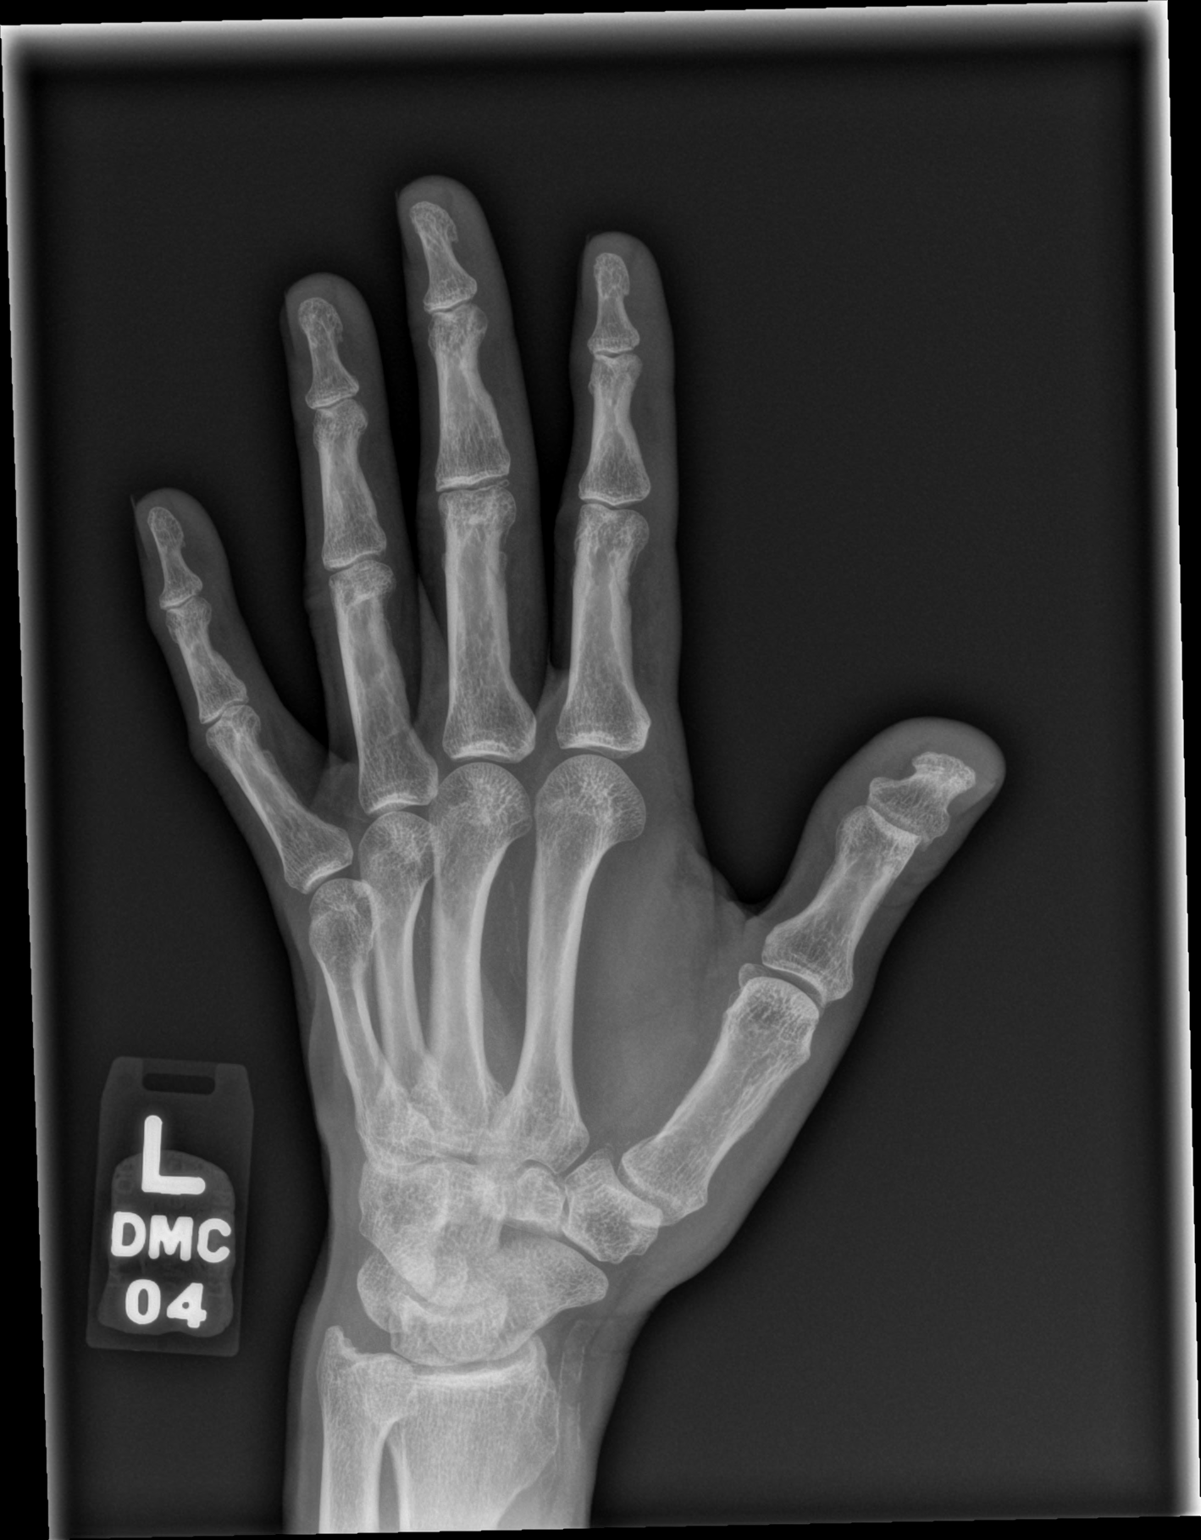
[im 3/3]
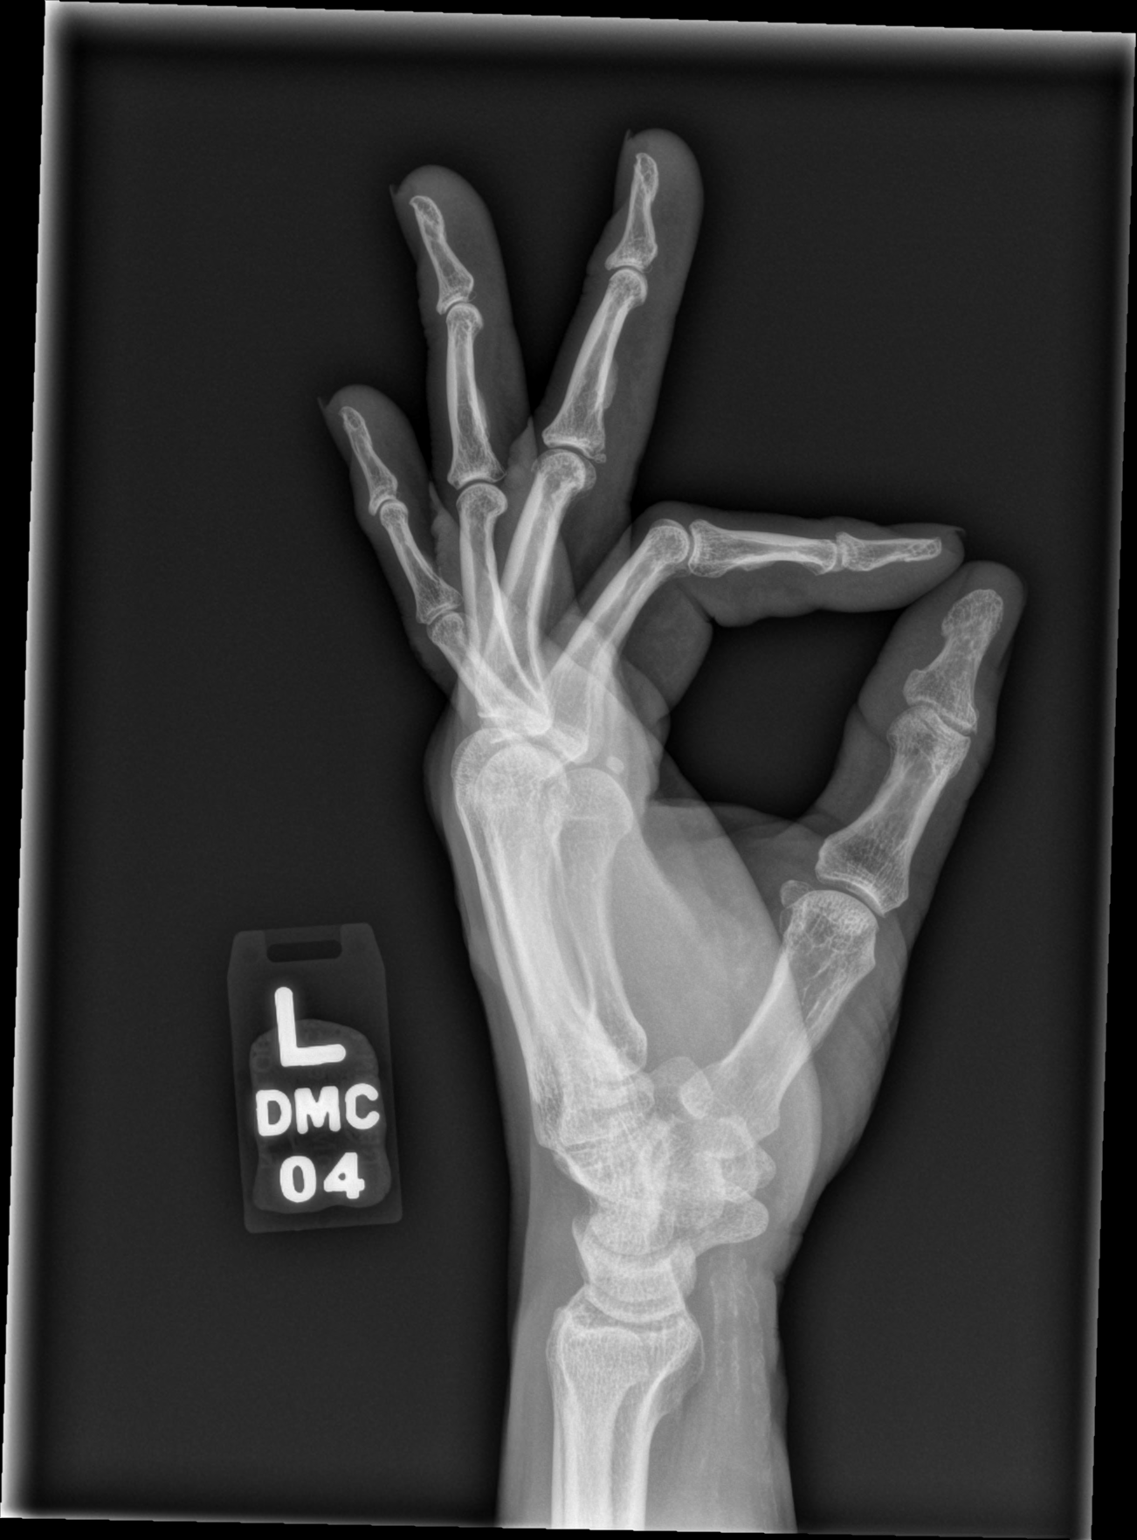

[3 of 3 positions shown; findings below may reference images not displayed]

FINDINGS: No bony or articular abnormality is identified. In particular, there is no evidence of fracture, dislocation, or radiopaque foreign body. No callus formation seen, 2 weeks post reported injury. Abnormally calcified radial and intercarpal arterial plaques suggesting diabetes or renal dysfunction.
IMPRESSION: No acute disease.

## 2020-09-11 IMAGING — CR HAND RT 3 VWS MIN
1 series · 3 of 3 positions shown · non-contrast
Comparison: None.

Right hand 3 views

INDICATIONS:  Right hand pain, fall

[Series 1: x hand pa right · 0.15mm/px · 3 of 3 slices shown]
[im 1/3]
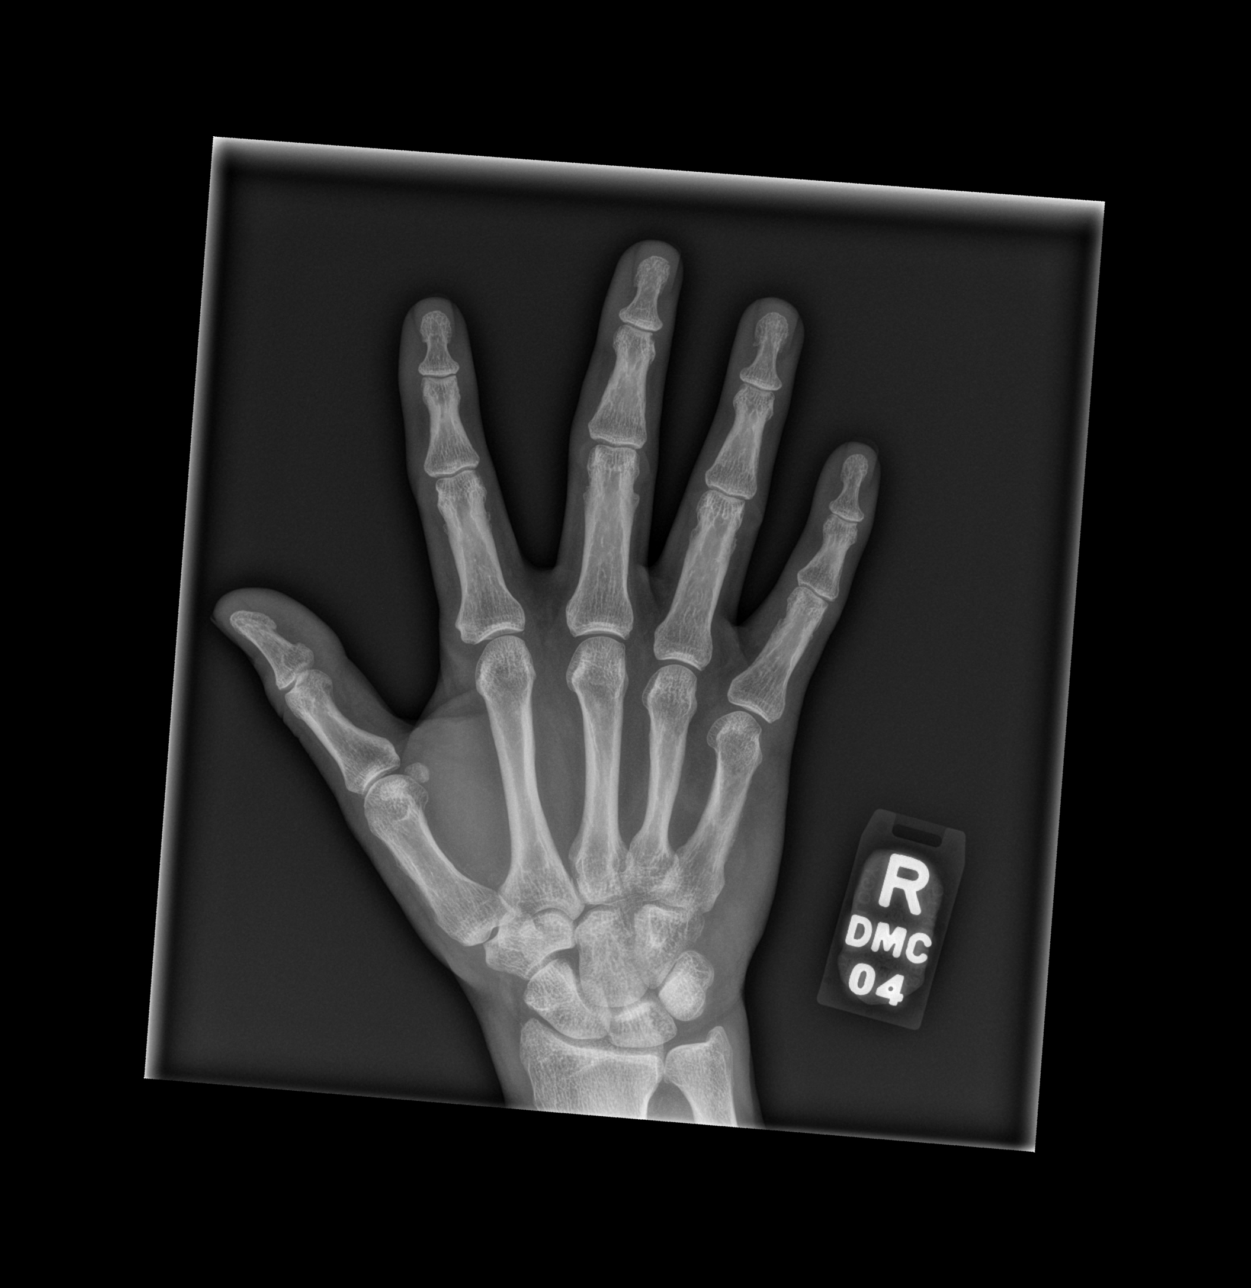
[im 2/3]
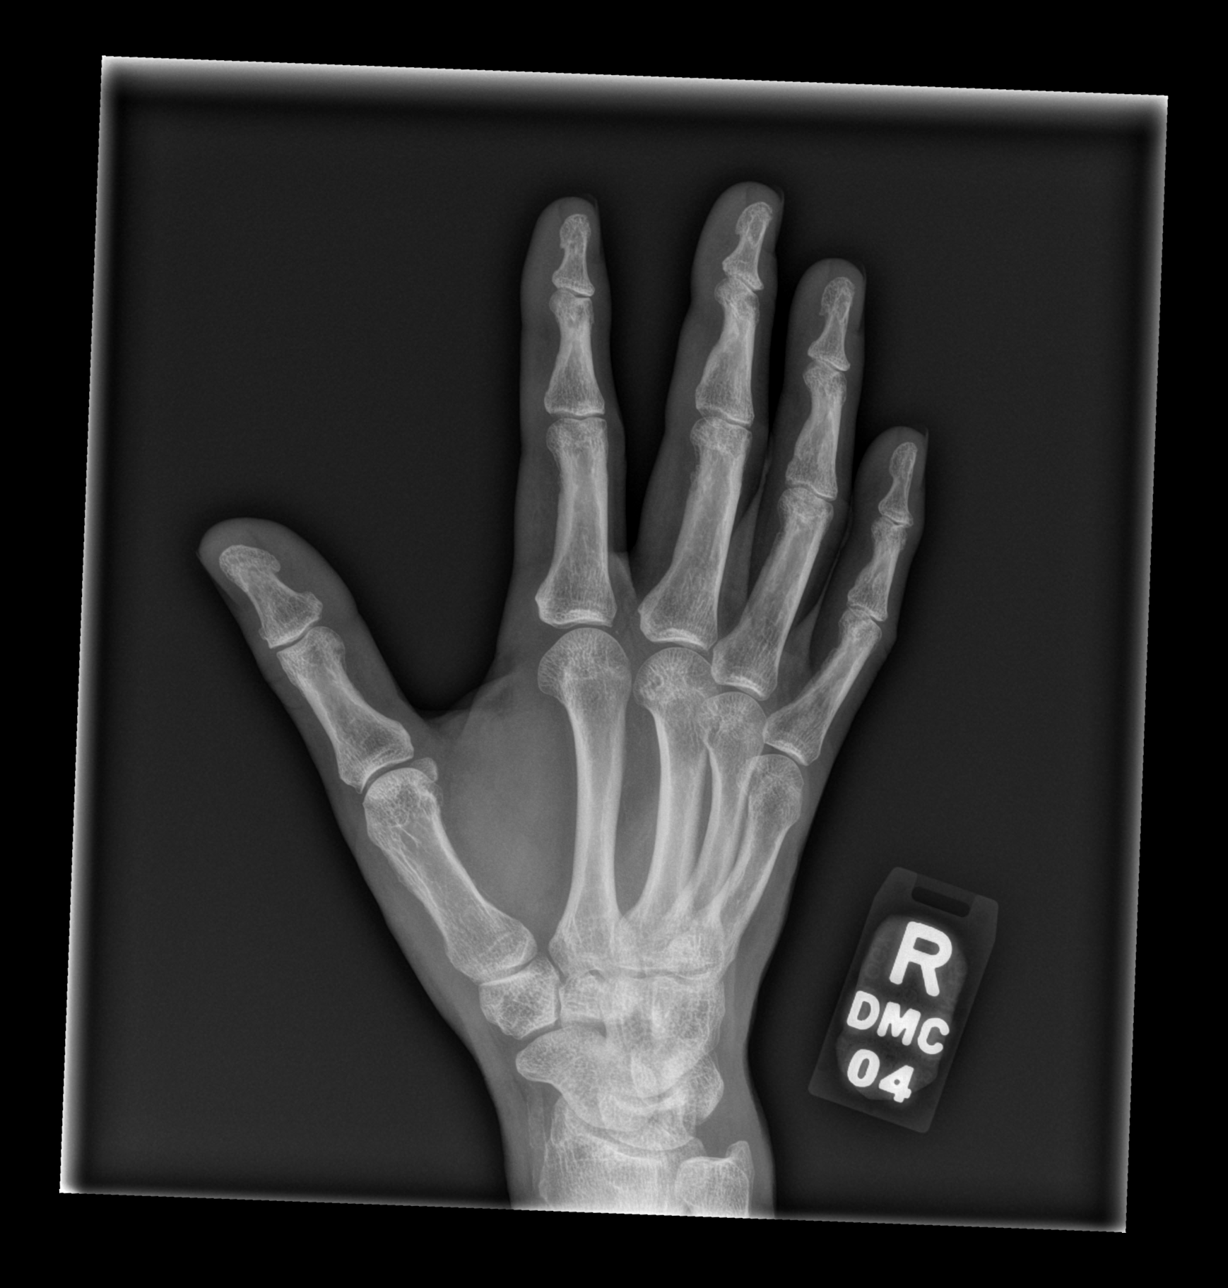
[im 3/3]
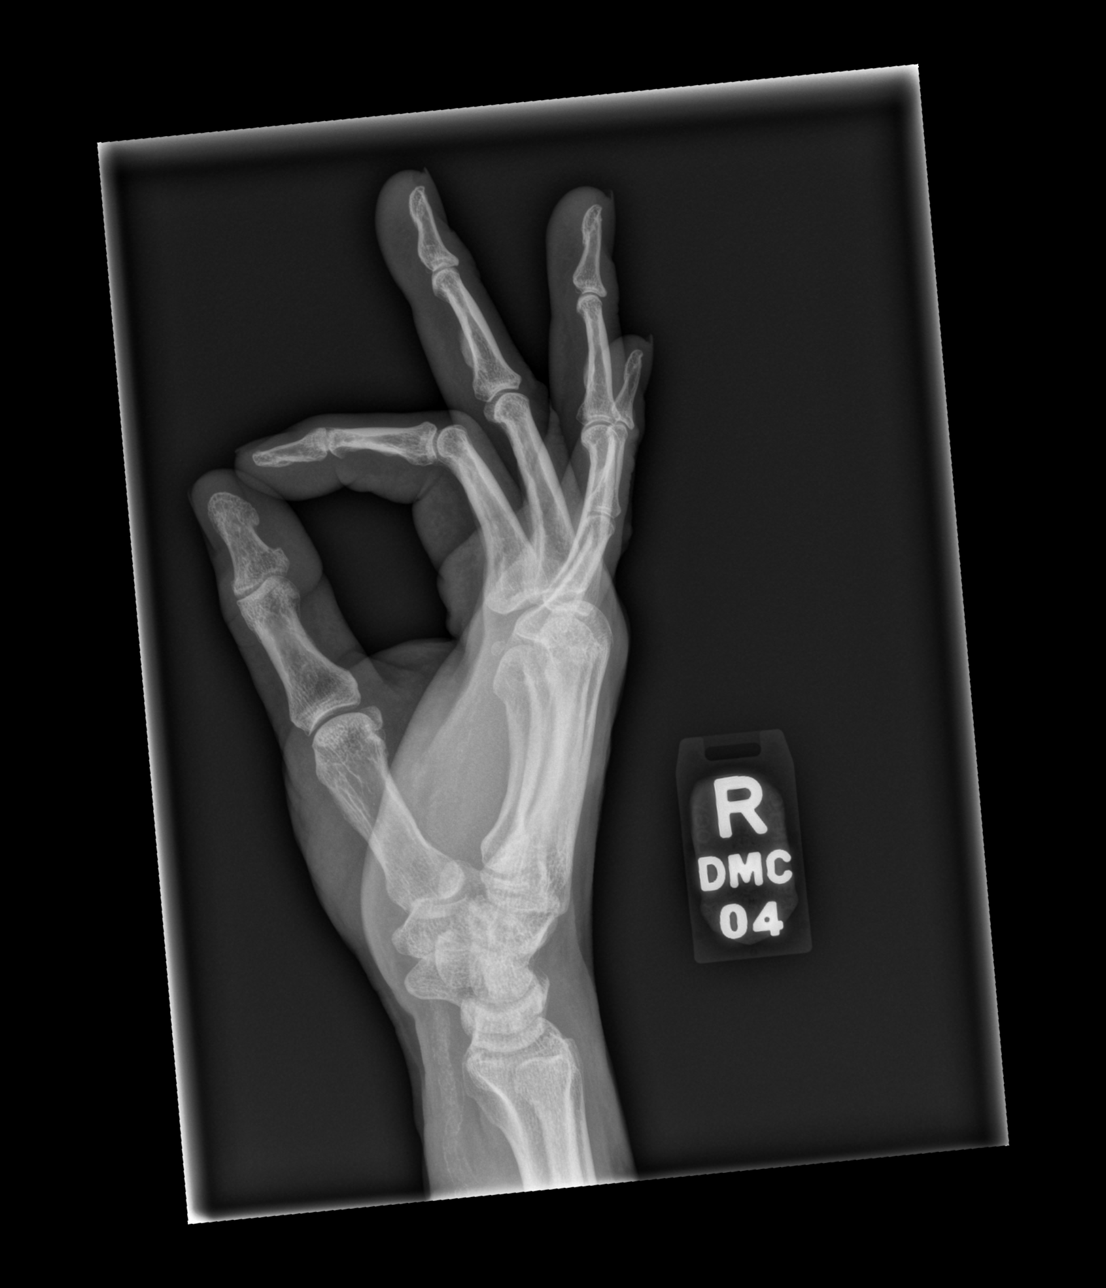

[3 of 3 positions shown; findings below may reference images not displayed]

FINDINGS: No bony or articular abnormality is identified. In particular, there is no evidence of fracture, dislocation, or radiopaque foreign body. No callus formation, 2 weeks post reported fall. Calcified arterial plaques similar to left hand.
IMPRESSION: No acute disease.

## 2021-07-24 IMAGING — CR FOOT RT 3 VWS MIN
1 series · 3 of 3 positions shown · non-contrast
Comparison: None available.

INDICATION: Right foot pain
TECHNIQUE: 3 views of the right foot are obtained.

[Series 1: x foot ap right · 0.15mm/px · 3 of 3 slices shown]
[im 1/3]
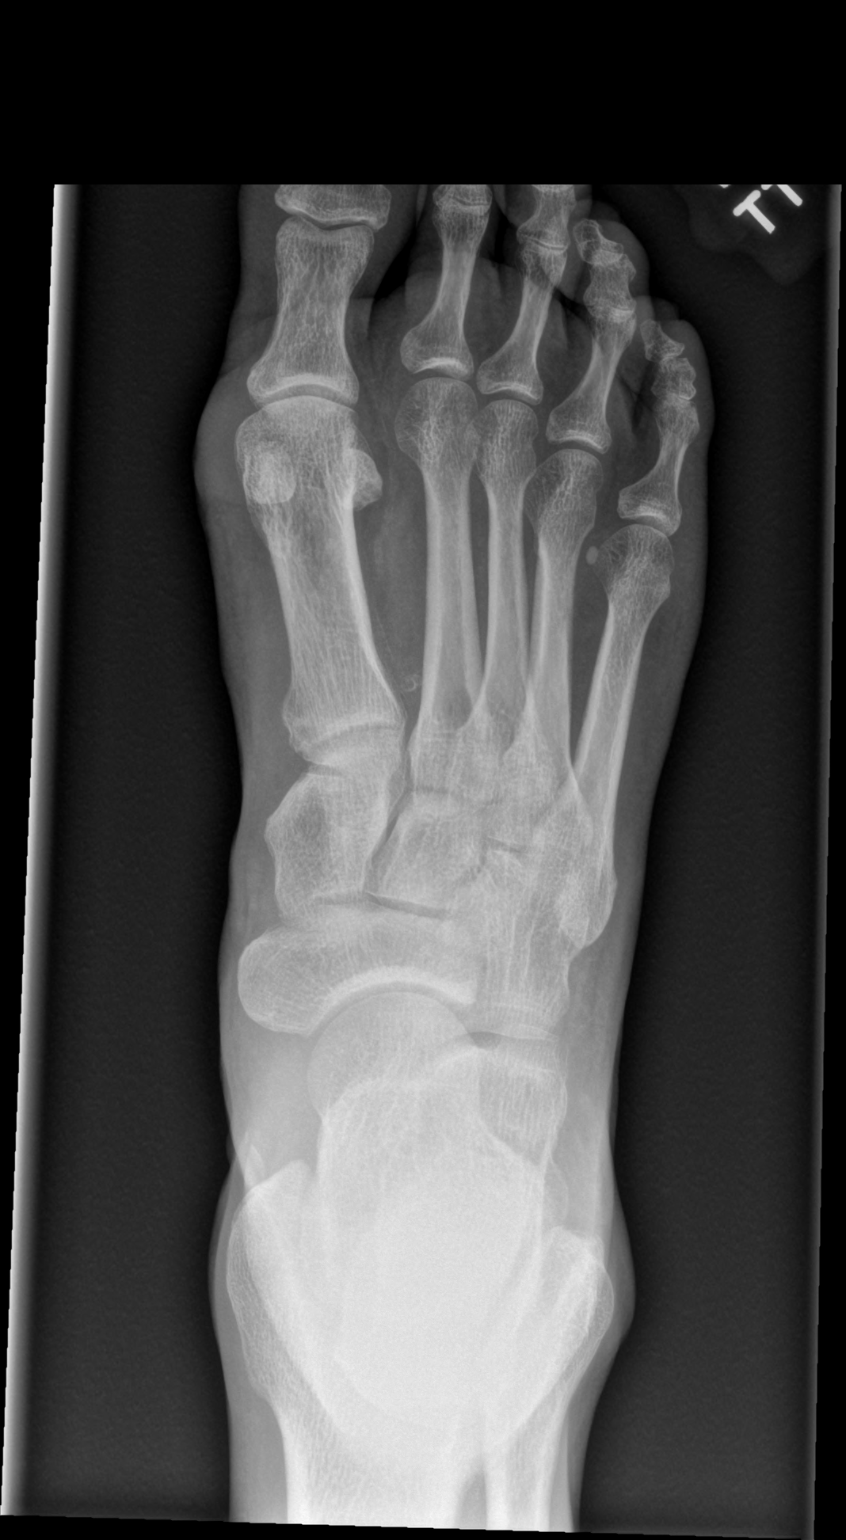
[im 2/3]
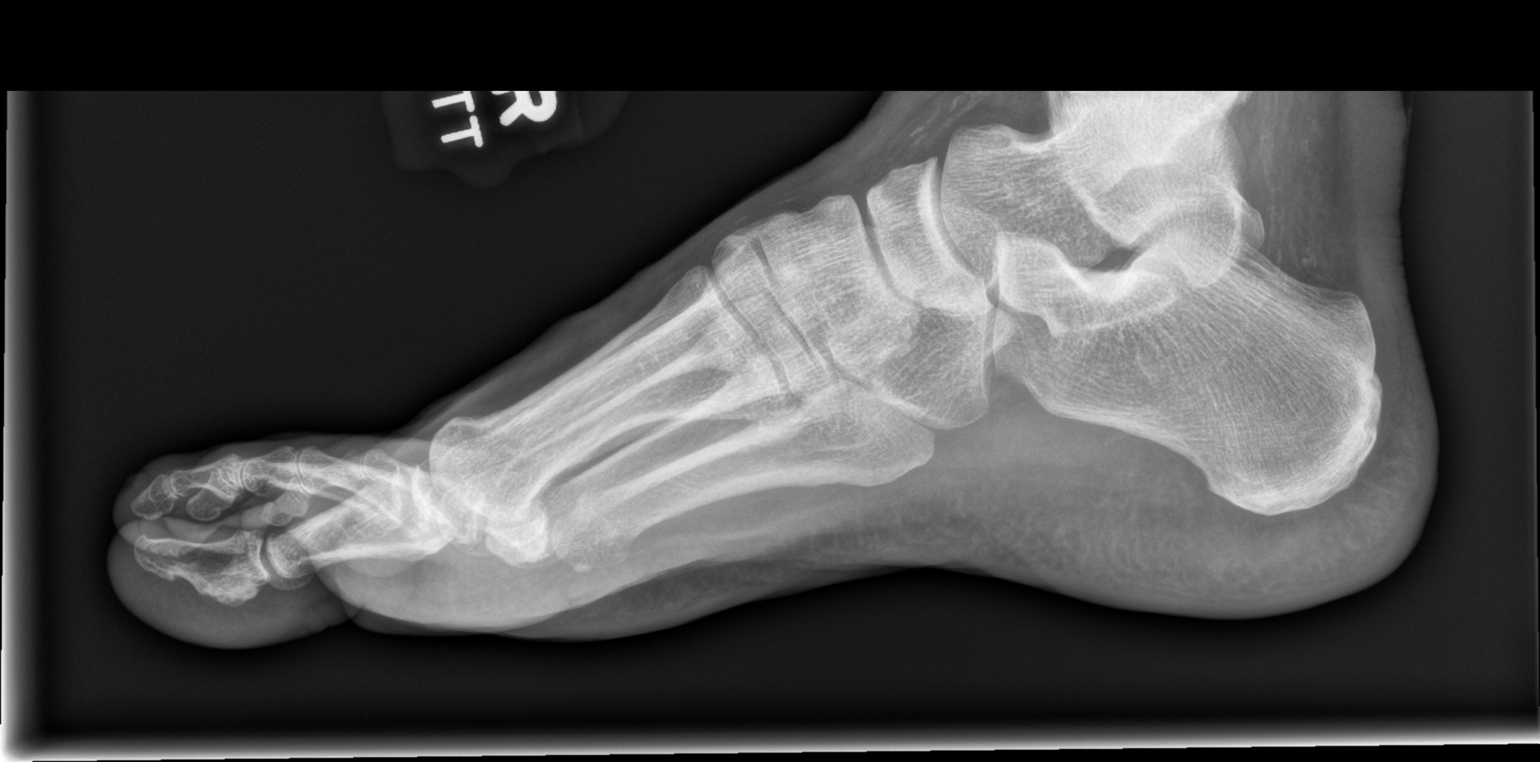
[im 3/3]
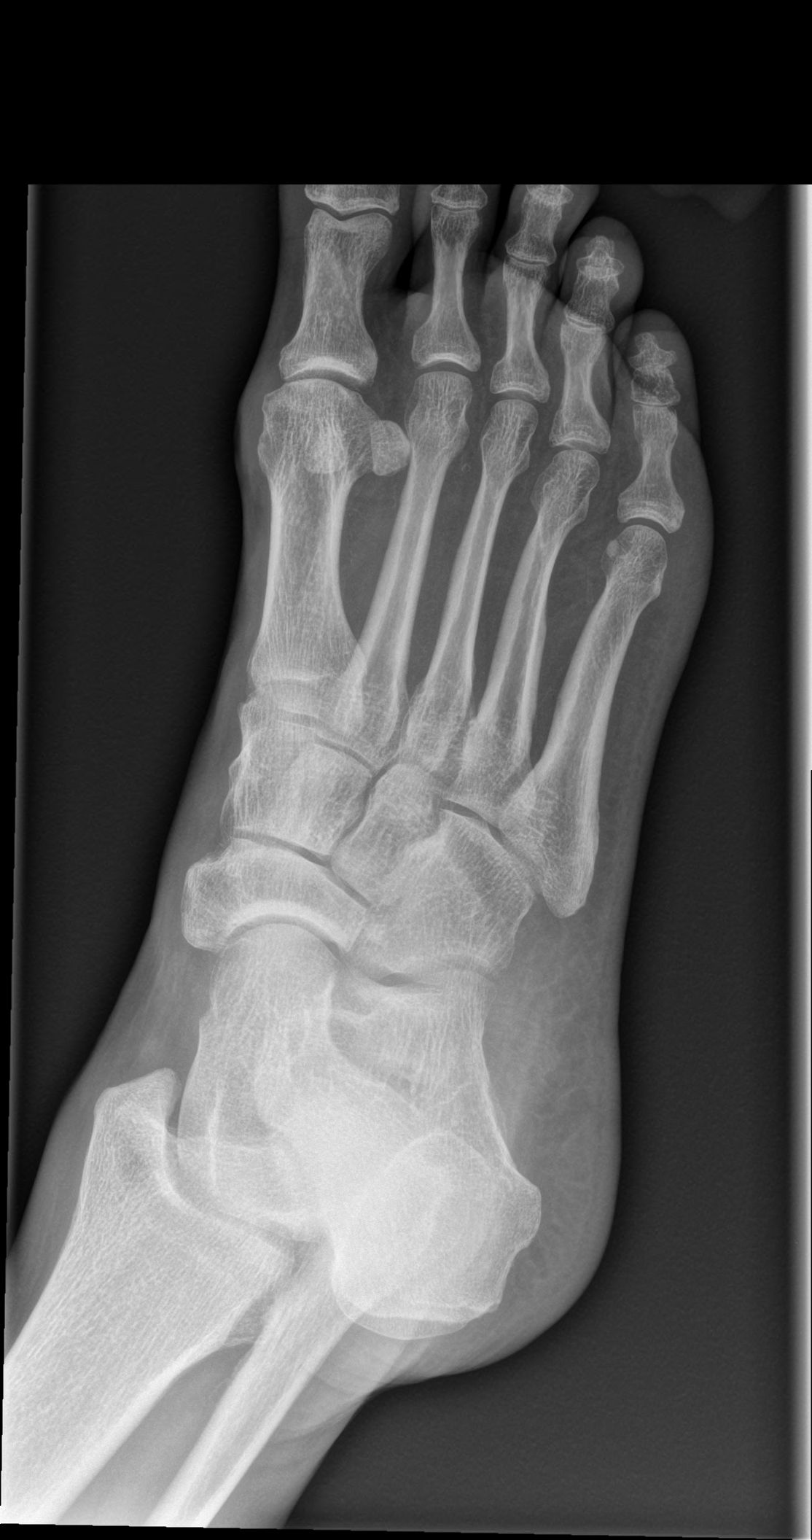

[3 of 3 positions shown; findings below may reference images not displayed]

FINDINGS: No acute fracture or dislocation. Normal mineralization. No erosions. Mild scattered osteoarthritis interphalangeal joints of the toes. Mild first MTP joint osteoarthritis. Soft tissue thickening is noted along the course of the distal Achilles tendon, which can be seen in setting of Achilles tendinosis. Diffuse vascular calcifications.
IMPRESSION: 1.
No acute fracture of the right foot.

2.
Soft tissue thickening along the course of the distal Achilles tendon, which can be seen in the setting of Achilles tendinosis.

## 2021-12-26 IMAGING — MR MRI BRAIN W/WO CONTRAST
11 series · 48 of 48 positions shown · IV contrast (prohance)
Comparison: None.

Images Obtained from [HOSPITAL] Imaging
HISTORY: Tremors, headaches
TECHNIQUE: Multiplanar, multisequential MRI images of the brain are obtained prior to and following 15 mL ProHance intravenous contrast.

[Series 5: flair_axial_fs · axial · 4.0mm · 0.98mm/px · z∈[-102,+59]mm · 2 of 32 slices shown]
[im 1/32]
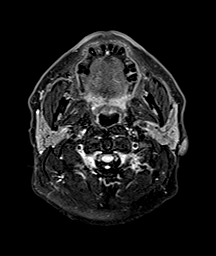
[im 32/32]
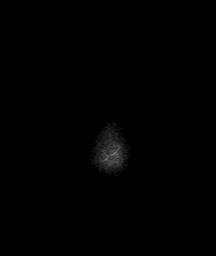

[Series 6: t2_axial · axial · 4.0mm · 0.49mm/px · z∈[-98,+63]mm · 2 of 32 slices shown]
[im 1/32]
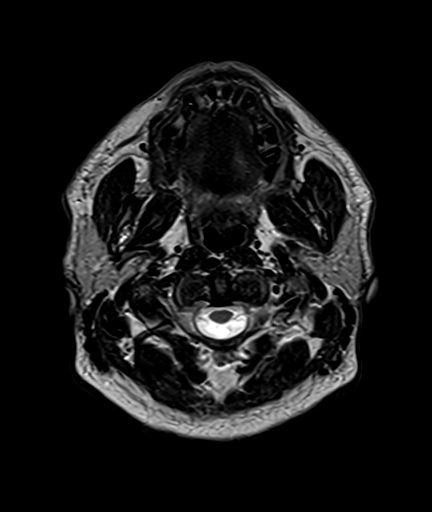
[im 32/32]
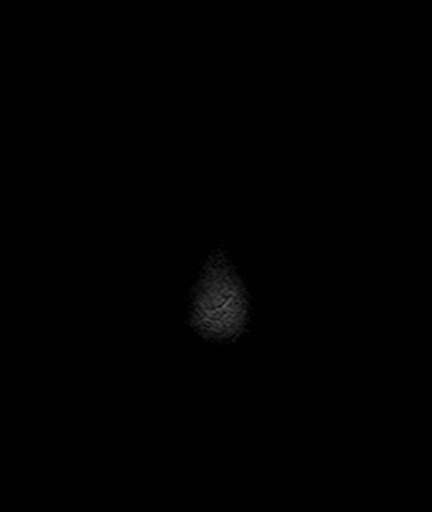

[Series 7: DWI · axial · 4.0mm · 1.42mm/px · z∈[-103,+57]mm · 2 of 32 slices shown (1 of 2)]
[im 1/32]
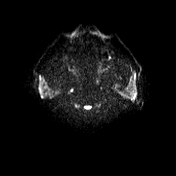
[im 32/32]
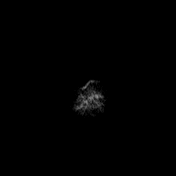

[Series 8: DWI · axial · 4.0mm · 1.42mm/px · 1 of 32 slices shown (2 of 2)]
[im 1/32]
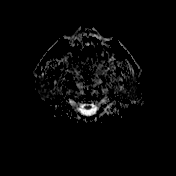

[Series 9: flash_axial · axial · 4.0mm · 0.98mm/px · 1 of 32 slices shown]
[im 1/32]
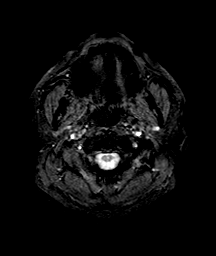

[Series 10: mprage_pre (no fs) · axial · 0.9mm · 0.98mm/px · z∈[-112,+58]mm · 7 of 191 slices shown]
[im 1/191]
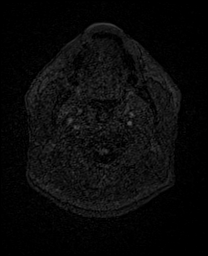
[im 32/191]
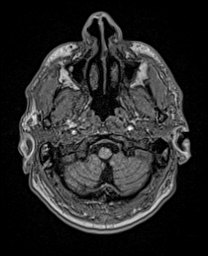
[im 64/191]
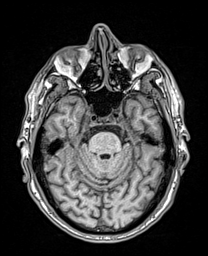
[im 96/191]
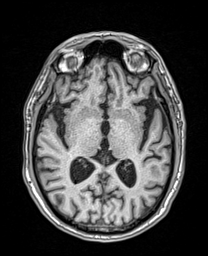
[im 127/191]
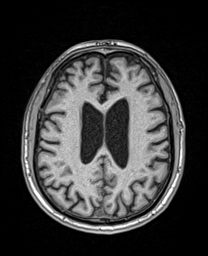
[im 159/191]
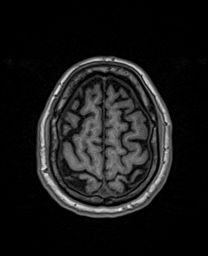
[im 191/191]
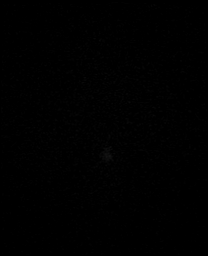

[Series 11: mprage_pre (no fs)_mpr_sagittal · sagittal · 1.0mm · 0.98mm/px · 6 of 168 slices shown]
[im 1/168]
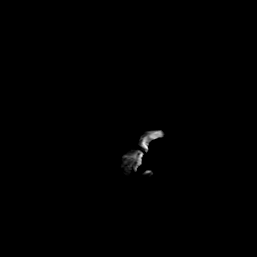
[im 34/168]
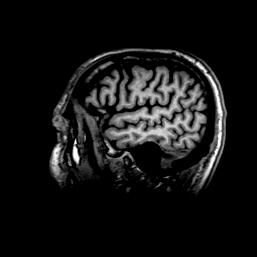
[im 67/168]
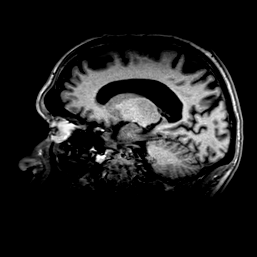
[im 101/168]
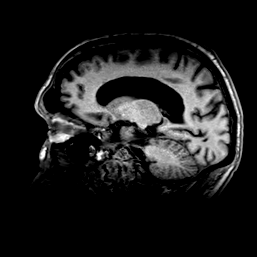
[im 134/168]
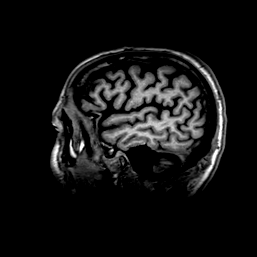
[im 168/168]
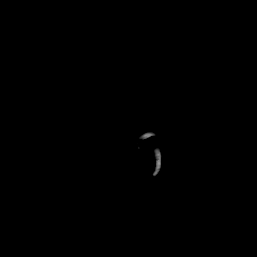

[Series 12: mprage_pre (no fs)_mpr_coronal · coronal · 1.0mm · 0.98mm/px · 7 of 198 slices shown]
[im 1/198]
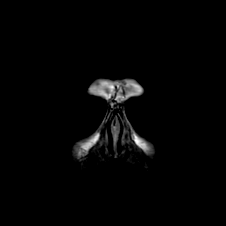
[im 33/198]
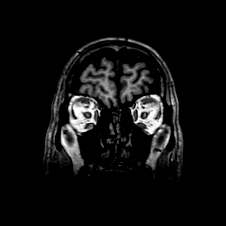
[im 66/198]
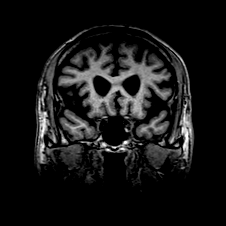
[im 99/198]
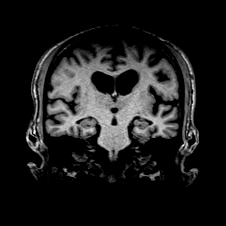
[im 132/198]
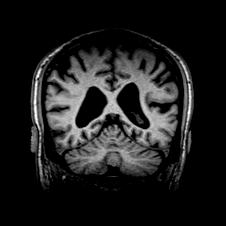
[im 165/198]
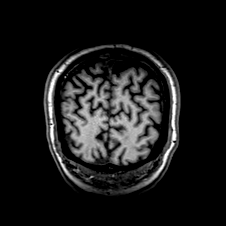
[im 198/198]
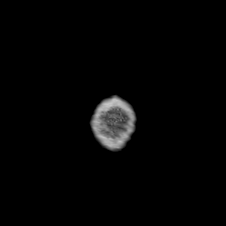

[Series 13: mprage_+c · axial · 0.9mm · 0.98mm/px · z∈[-112,+59]mm · 7 of 192 slices shown]
[im 1/192]
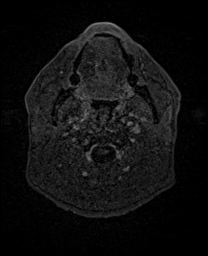
[im 32/192]
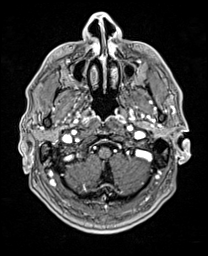
[im 64/192]
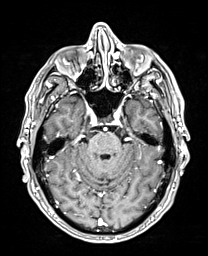
[im 96/192]
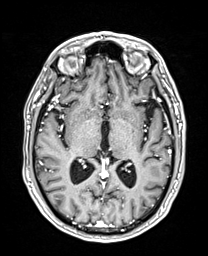
[im 128/192]
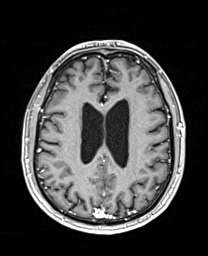
[im 160/192]
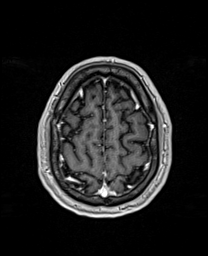
[im 192/192]
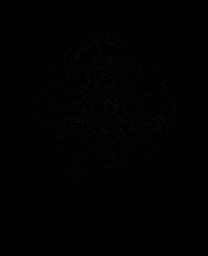

[Series 14: mprage_+c_mpr_coronal · coronal · 1.0mm · 0.98mm/px · 7 of 198 slices shown]
[im 1/198]
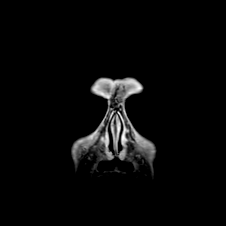
[im 33/198]
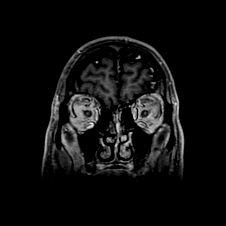
[im 66/198]
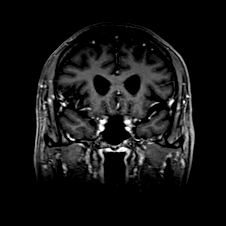
[im 99/198]
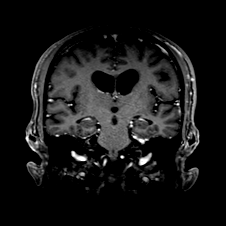
[im 132/198]
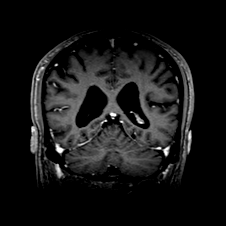
[im 165/198]
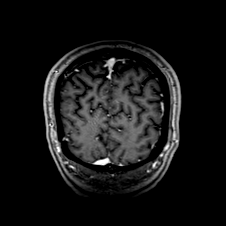
[im 198/198]
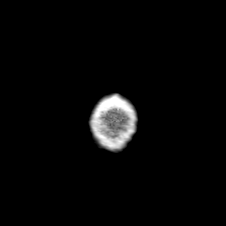

[Series 15: mprage_+c_mpr_sagittal · sagittal · 1.0mm · 0.98mm/px · 6 of 168 slices shown]
[im 1/168]
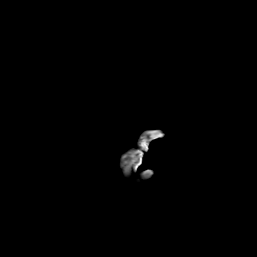
[im 34/168]
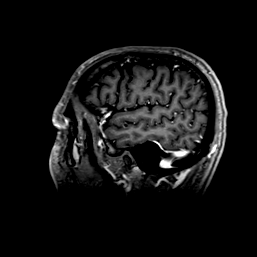
[im 67/168]
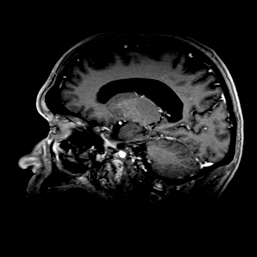
[im 101/168]
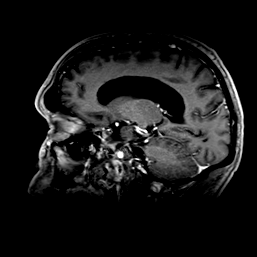
[im 134/168]
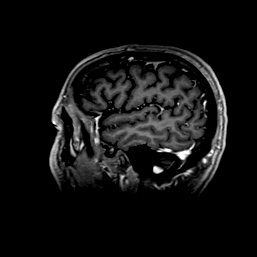
[im 168/168]
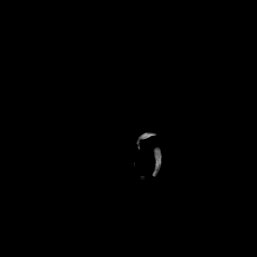

[48 of 48 positions shown; findings below may reference images not displayed]

FINDINGS: There is prominence of ventricles and cortical sulci. A few periventricular and subcortical as well as pontine FLAIR and T2 hyperintensity foci seen. Midline structures are normal. Flow voids of main
intracranial vasculature are normal. There is no mass lesion or midline shift. No extra-axial fluid collections seen. No area of restricted diffusion, hemosiderin deposit or abnormal enhancement
seen.
Mild mucosal thickening of ethmoid air cells, frontal sinuses and maxillary sinuses seen. The rest of the orbits, paranasal sinuses, mastoid air cells and skull marrow signal are normal.
IMPRESSION: 1.  Mild cerebral atrophy or involutional changes with mild chronic small vessel ischemic changes seen.
2.  Mild chronic paranasal sinusitis seen.
3.  No other acute disease.

## 2022-05-19 IMAGING — CR KNEE RT 4 VWS MIN
1 series · 4 of 4 positions shown · non-contrast
Comparison: None

Images Obtained from [HOSPITAL] Imaging
Right knee radiographs, 4 views
INDICATION: Pain in right knee

[Series 1: x knee sunrise right · 0.15mm/px · 4 of 4 slices shown]
[im 1/4]
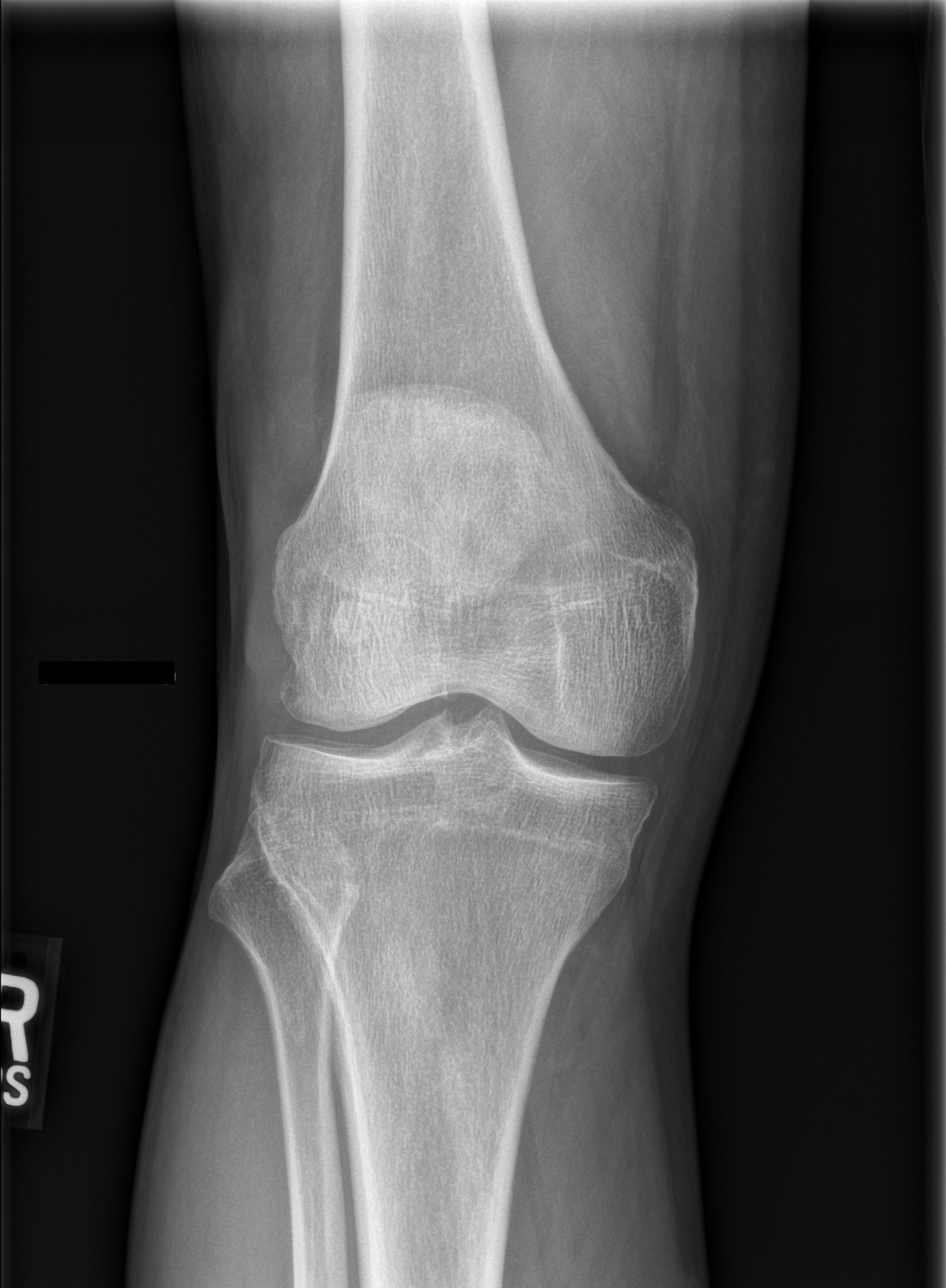
[im 2/4]
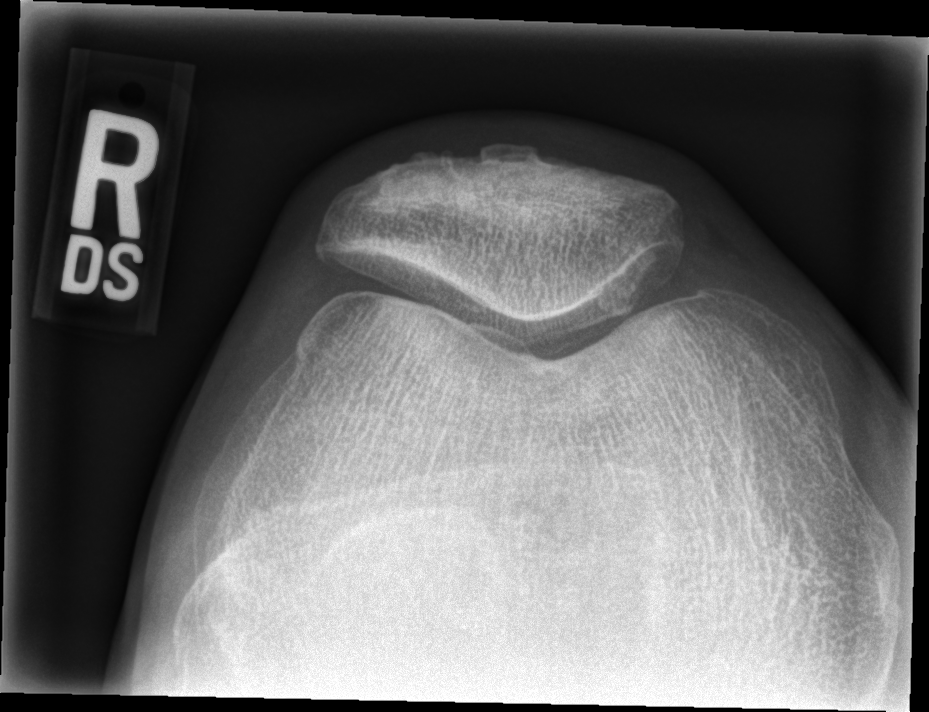
[im 3/4]
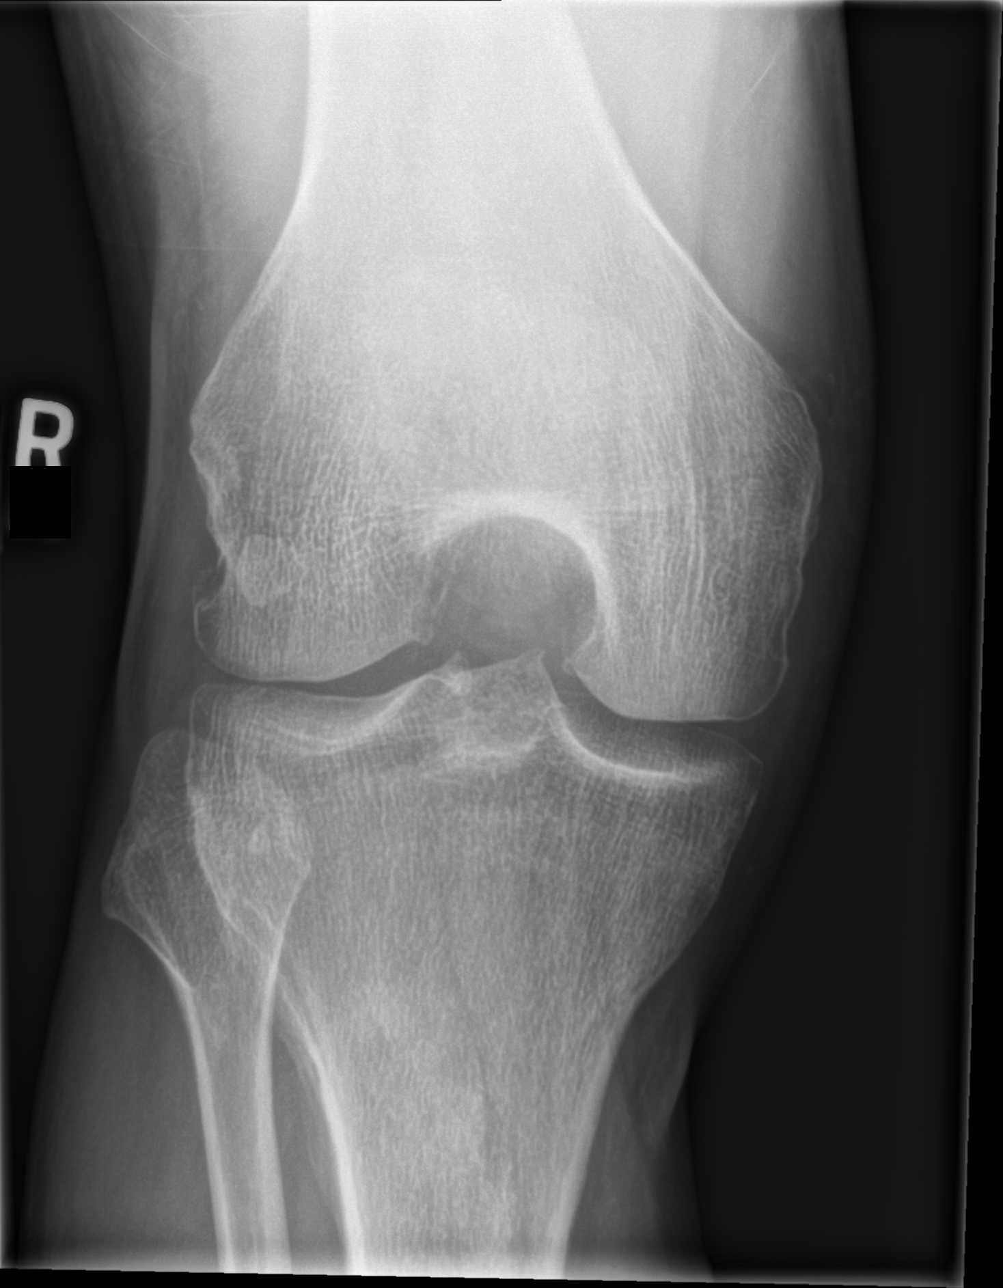
[im 4/4]
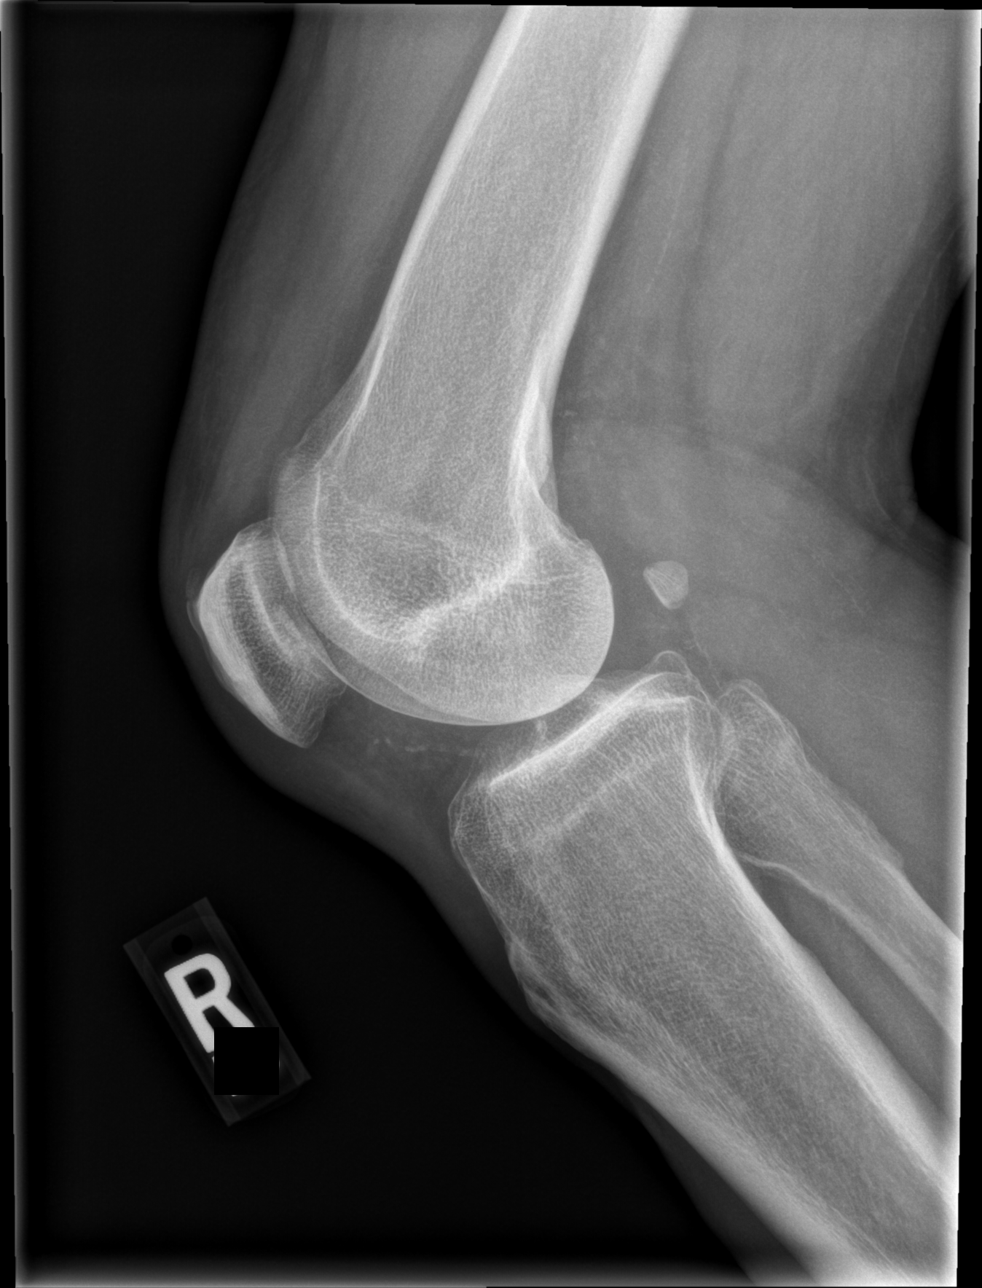

[4 of 4 positions shown; findings below may reference images not displayed]

FINDINGS: No acute fracture. No dislocation. No joint effusion. Joint spaces are intact.
IMPRESSION: Unremarkable right knee radiographs.
